# Patient Record
Sex: Male | Born: 1955 | Race: White | Hispanic: No | Marital: Single | Smoking: Former smoker
Health system: Southern US, Community
[De-identification: ages and names within clinical notes are randomized; demographics above are authoritative.]

## PROBLEM LIST (undated history)

## (undated) ENCOUNTER — Encounter
Attending: Pharmacist Clinician (PhC)/ Clinical Pharmacy Specialist | Primary: Pharmacist Clinician (PhC)/ Clinical Pharmacy Specialist

## (undated) ENCOUNTER — Ambulatory Visit

## (undated) ENCOUNTER — Telehealth

## (undated) ENCOUNTER — Encounter

## (undated) ENCOUNTER — Ambulatory Visit: Attending: Family | Primary: Family

## (undated) ENCOUNTER — Ambulatory Visit: Attending: Gastroenterology | Primary: Gastroenterology

## (undated) ENCOUNTER — Ambulatory Visit
Attending: Pharmacist Clinician (PhC)/ Clinical Pharmacy Specialist | Primary: Pharmacist Clinician (PhC)/ Clinical Pharmacy Specialist

## (undated) ENCOUNTER — Ambulatory Visit: Attending: Family Medicine | Primary: Family Medicine

## (undated) ENCOUNTER — Encounter
Attending: Student in an Organized Health Care Education/Training Program | Primary: Student in an Organized Health Care Education/Training Program

## (undated) ENCOUNTER — Ambulatory Visit: Attending: Vascular Surgery | Primary: Vascular Surgery

## (undated) ENCOUNTER — Encounter: Attending: Family | Primary: Family

## (undated) ENCOUNTER — Encounter: Attending: Nurse Practitioner | Primary: Nurse Practitioner

## (undated) ENCOUNTER — Telehealth: Attending: Family | Primary: Family

## (undated) DIAGNOSIS — Z8619 Personal history of other infectious and parasitic diseases: Secondary | ICD-10-CM

## (undated) DIAGNOSIS — K7460 Unspecified cirrhosis of liver: Secondary | ICD-10-CM

---

## 2006-07-07 ENCOUNTER — Ambulatory Visit: Payer: Self-pay | Admitting: Family Medicine

## 2006-07-08 ENCOUNTER — Ambulatory Visit: Payer: Self-pay | Admitting: Family Medicine

## 2006-07-10 ENCOUNTER — Other Ambulatory Visit: Payer: Self-pay

## 2006-07-17 ENCOUNTER — Ambulatory Visit: Payer: Self-pay | Admitting: General Surgery

## 2011-12-12 DIAGNOSIS — K7460 Unspecified cirrhosis of liver: Secondary | ICD-10-CM | POA: Diagnosis present

## 2012-08-13 DIAGNOSIS — F1110 Opioid abuse, uncomplicated: Secondary | ICD-10-CM | POA: Diagnosis present

## 2017-10-23 ENCOUNTER — Ambulatory Visit: Admit: 2017-10-23 | Discharge: 2017-10-24

## 2017-10-23 DIAGNOSIS — I10 Essential (primary) hypertension: Principal | ICD-10-CM

## 2017-11-11 ENCOUNTER — Ambulatory Visit: Admit: 2017-11-11 | Discharge: 2017-11-12 | Attending: Gastroenterology | Primary: Gastroenterology

## 2017-11-11 DIAGNOSIS — B1920 Unspecified viral hepatitis C without hepatic coma: Principal | ICD-10-CM

## 2017-11-11 LAB — MONOCYTES ABSOLUTE COUNT: Lab: 0.4

## 2017-11-11 LAB — CBC W/ AUTO DIFF
BASOPHILS ABSOLUTE COUNT: 0 10*9/L (ref 0.0–0.1)
EOSINOPHILS ABSOLUTE COUNT: 0.1 10*9/L (ref 0.0–0.4)
HEMATOCRIT: 37.2 % — ABNORMAL LOW (ref 41.0–53.0)
HEMOGLOBIN: 12.4 g/dL — ABNORMAL LOW (ref 13.5–17.5)
LARGE UNSTAINED CELLS: 3 % (ref 0–4)
LYMPHOCYTES ABSOLUTE COUNT: 1.2 10*9/L — ABNORMAL LOW (ref 1.5–5.0)
MEAN CORPUSCULAR HEMOGLOBIN CONC: 33.2 g/dL (ref 31.0–37.0)
MEAN CORPUSCULAR HEMOGLOBIN: 37.1 pg — ABNORMAL HIGH (ref 26.0–34.0)
MEAN CORPUSCULAR VOLUME: 111.6 fL — ABNORMAL HIGH (ref 80.0–100.0)
MONOCYTES ABSOLUTE COUNT: 0.4 10*9/L (ref 0.2–0.8)
NEUTROPHILS ABSOLUTE COUNT: 2.2 10*9/L (ref 2.0–7.5)
PLATELET COUNT: 58 10*9/L — ABNORMAL LOW (ref 150–440)
RED BLOOD CELL COUNT: 3.34 10*12/L — ABNORMAL LOW (ref 4.50–5.90)
RED CELL DISTRIBUTION WIDTH: 14.5 % (ref 12.0–15.0)
WBC ADJUSTED: 4 10*9/L — ABNORMAL LOW (ref 4.5–11.0)

## 2017-11-11 LAB — SMEAR REVIEW

## 2017-11-11 LAB — HEPATITIS A IGG: Hepatitis A virus Ab.IgG:PrThr:Pt:Ser:Ord:: REACTIVE — AB

## 2017-11-11 LAB — COMPREHENSIVE METABOLIC PANEL
ALBUMIN: 3 g/dL — ABNORMAL LOW (ref 3.5–5.0)
ALKALINE PHOSPHATASE: 100 U/L (ref 38–126)
ALT (SGPT): 40 U/L (ref 19–72)
ANION GAP: 9 mmol/L (ref 9–15)
AST (SGOT): 81 U/L — ABNORMAL HIGH (ref 19–55)
BILIRUBIN TOTAL: 1.2 mg/dL (ref 0.0–1.2)
BLOOD UREA NITROGEN: 19 mg/dL (ref 7–21)
CALCIUM: 9.1 mg/dL (ref 8.5–10.2)
CHLORIDE: 103 mmol/L (ref 98–107)
CO2: 28 mmol/L (ref 22.0–30.0)
EGFR MDRD AF AMER: >=60 mL/min/{1.73_m2} (ref >=60)
EGFR MDRD NON AF AMER: >=60 mL/min/{1.73_m2} (ref >=60)
GLUCOSE RANDOM: 72 mg/dL (ref 65–179)
PROTEIN TOTAL: 6.9 g/dL (ref 6.5–8.3)
SODIUM: 140 mmol/L (ref 135–145)

## 2017-11-11 LAB — HEPATITIS B SURFACE ANTIGEN: Hepatitis B virus surface Ag:PrThr:Pt:Ser:Ord:: NONREACTIVE

## 2017-11-11 LAB — PROTIME-INR: INR: 1.19

## 2017-11-11 LAB — PROTIME: Lab: 13.6 — ABNORMAL HIGH

## 2017-11-11 LAB — TRANSFERRIN: Transferrin:MCnc:Pt:Ser/Plas:Qn:: 248.8

## 2017-11-11 LAB — HEPATITIS B SURFACE ANTIBODY
HEPATITIS B SURFACE ANTIBODY QUANT: 187.17 m[IU]/mL — ABNORMAL HIGH (ref <8.00)
HEPATITIS B SURFACE ANTIBODY: REACTIVE — AB

## 2017-11-11 LAB — FERRITIN: Ferritin:MCnc:Pt:Ser/Plas:Qn:: 185

## 2017-11-11 LAB — IRON & TIBC
IRON: 123 ug/dL (ref 35–165)
TRANSFERRIN: 248.8 mg/dL (ref 200.0–380.0)

## 2017-11-11 LAB — CO2: Carbon dioxide:SCnc:Pt:Ser/Plas:Qn:: 28

## 2017-11-11 LAB — AFP-TUMOR MARKER: Alpha-1-Fetoprotein.tumor marker:MCnc:Pt:Ser/Plas:Qn:: 10.8 — ABNORMAL HIGH

## 2017-11-11 LAB — SLIDE REVIEW

## 2017-11-11 LAB — HEPATITIS B SURFACE ANTIBODY QUANT: Hepatitis B virus surface Ab:ACnc:Pt:Ser:Qn:: 187.17 — ABNORMAL HIGH

## 2017-11-11 NOTE — Unmapped (Addendum)
[facility]  FAST ??? Fibrosis Assessment Team  Division of Gastroenterology and Hepatology  ??  ??    ??  FIBROSCAN will be performed to assess hepatic fibrosis (scarring) in order to stage this patient's liver disease. This will assist with evaluating the natural course of the disease and will provide important information regarding prognosis, duration of therapy, and potential response to treatment. This information will also help assess risk for hepatocellular carcinoma and need for liver cancer surveillance.??  ??  FibroscanProcedure:   After obtaining verbal consent, the patient was placed in a supine position. Physical characteristics and landmarks were assessed to establish appropriate mid-axillary intercostal space for probe placement. 50Hz  Shear Wave pulses were applied and the resulting Shear Wave and Propagation Speed detected with a 3.5 MHz ultrasonic signal, using the FibroScan probe.  Skin to liver capsule distance and liver parenchyma were accessed during the entire examination with the FibroScan probe. The patient was instructed to breathe normally and to abstain from sudden movements during the procedure, allowing for random measurements of liver stiffness. At least ten Shear Waves were produced; individual measurements of each Shear Wave were calculated. Patient tolerated the procedure well and was discharged without incident.  ??  Probe used []  M+    Serial # [id number]                         [x]  XL+   Serial # [id number]  ??  Main Etiology of Liver Disease:  [x] HCV     [] HBV   [] Alcohol    [] NASH  [] PBC      [] PSC     [] Other________________  ??  50Hz  shear wave pulses were applied and the resulting shear wave and propagation speed detected with a 3.5 MHz ultrasonic signal, using the FibroScan probe.     ??  At least ten Shear Waves were produced; individual measurements of each shear wave were calculated.    ??  Patient tolerated the procedure well and was discharged without incident.  ??  Fibroscan score: ___33.4___kPa  ??  IQR:                       ___10___%  ??  Test performed by: Luiz Ochoa, RN  ??  [facility]  FAST ??? Fibrosis Assessment Team  Division of Gastroenterology and Hepatology  ??  ??    ??  ??  ??  Estimation of the stage of liver fibrosis (Metavir Score):  The results of the Liver Stiffness Score are consistent with the following liver fibrosis stage:  ??  ??  []  F0-F1             []  F2               []  F3               [x]  F4  ??  ??  GENERAL RECOMMENDATIONS ACCORDING TO THE STAGE OF LIVER FIBROSIS.  ??  F0-F1: No-minimal fibrosis. The risk of progression to advanced fibrosis and cirrhosis is low. If the cause of liver disease is not removed, a 1-2 yr follow-up study is recommended.   F2: Significant fibrosis. There is a moderate risk of progression to cirrhosis. If the cause of liver disease is not removed, a follow-up study in 12 months is recommended.  F3: Advanced (pre-cirrhotic stage). The risk of progression to cirrhosis is high. Imaging studies to rule out hepatocellular carcinoma  should be considered. Efforts to remove the cause of liver disease are highly recommended.  F4: Cirrhosis. There is significant risk of portal hypertension and esophageal varices. An upper endoscopy is recommended. Imaging studies for hepatocellular carcinoma screening are recommended.   ??  Any and all FibroScan studies must be carefully evaluated, taking fully into account all individual measurement/scans, patient history and other factors.  As with liver biopsy, any estimation of liver fibrosis may be subject to under or over staging due to sampling error.  Any further medical or surgical intervention should be made only while fully considering the circumstances of this patient and in consultation with this patient.    I have reviewed and interpreted the FIBROSCAN test results as described above.  ??  Alba Destine, [name]  Professor of Medicine  Director, [facility]  [place] at [place] [phone]

## 2017-11-11 NOTE — Unmapped (Signed)
Counseling for HCV treatment     B18.2 Hep C: yes    K74.60 Cirrhosis: yes,   Child Pugh Score if applicable and for Medicaid pts: N/A  Z94.4 Liver Transplant: no    Genotype: 1a (external records 09/08/17 pg 6)  HCV RNA: 1.5 million IU/mL   Date: 08/25/17 (external records 09/08/17 pg 6)   Fibrosis score: F4 (33.4 kPa) on Fibroscan 11/11/17  HIV Co-infection? No (11/06/11)  Signs of liver decompensation? No  Previous treatment? Treatment naive    Planned regimen: Epclusa (sofosbuvir/velpatasvir 400/100mg ) x 12 weeks  Urgency: Routine Request  Prescribing Provider/NPI: Dr. Gavin Potters / [id number]  Signature waiver form not obtained at this time.  Insurance: None, Glass [employer] Patient Assistance Program (MPAP) application with patient.       Colin Valentine is a 62 y.o. caucasian male presents to clinic alone and is interested in starting treatment with Epclusa.   We discussed the authorization process of obtaining the medication through MPAP and that this may take some time.  Stressed importance of being able to be reached by phone. Advised would need to complete his screening procedures including MRI, EGD and colonoscopy before we proceed with treatment. Pt in agreement.      Current medications:  Current Outpatient Prescriptions   Medication Sig Dispense Refill   ??? cloNIDine (CATAPRES) 0.1 MG tablet Take 0.1 mg by mouth. :one pill up to twice daily for withdrawl symptoms     ??? furosemide (LASIX) 20 MG tablet Take 20 mg by mouth daily.     ??? methadone (DOLOPHINE) 10 mg/5 mL solution Take 95 mg by mouth daily.       No current facility-administered medications for this visit.    Other medications: Naproxen PRN (takes almost daily for foot pain)    Following topics were discussed during counseling:    1. Indications for medication, dosage and administration.     A. Epclusa 400/100mg  1 tablet to take daily with or without food.    2. Common side effects of medications and management strategies. (fatigue, headache)     A. Advised patient to avoid use of naproxen. APAP can be used instead (limit to < 2,000 mg/day).    3. Importance of adherence to regimen, follow-up clinic visits and lab monitoring.     A. Asked patient to call [facility] [phone] to establish start date for treatment and to schedule appointment 4 weeks before starting treatment.    4. Drug-drug interaction.    A. Current medications have been reviewed and assessed for possible interaction.  We discussed the mechanism of drug-drug interaction with acid lowering agents. Advised to check with MD or pharmacist before taking any OTC/herbal medications, with emphasis regarding indigestion/heartburn medications.  Denies use of OTC heart burn medications or herbal medication such as milk thistle or St. John's wart. Recommended to avoid use of NSAIDs given cirrhosis. Allergies have been verified. Denies alcohol intake.    5. Importance of informing pharmacy and clinic of updated contact information.  Discussed the process of obtaining medication through specialty pharmacy and when approved medication will be delivered to patient's home. Stressed importance of being able to be reached over the phone.    Patient verbalized understanding. Provided contact information for any questions/concerns.     Frances Maywood, PharmD, BCPS  PGY2 Ambulatory Care Resident  Pgr #: [phone]    Park Breed, Pharm D., BCPS, BCGP, CPP  [facility]  [address]  [place], [place]  [address]  [phone]    This portion of the visit was 20 minutes in duration and greater than 50% was spend in direct counseling and coordination of care regarding hepatitis C medication management.

## 2017-11-11 NOTE — Unmapped (Addendum)
Chronic hepatitis C with evidence of cirrhosis. Congratulations on stopping drinking and it is important that you remain abstinent from alcohol to maintain liver health. You are also at risk for fatty liver disease due to being overweight.  Losing 30 lbs with better diet and increasing walking would be helpful to maintain liver health. You will have a colonoscopy and upper endoscopy scheduled to evaluate for dilated blood vessels in the esophagus. You need an MRI every 6 months to evaluate for the presence of liver cancer for which you are at risk. You will emet our pharmacist Erskine Squibb to discuss treatment for hepatitis C with medications that have a high rate of cure and a low risk of side effects. Return to office in 3 months or sooner to see Owens Shark DNP once the hepatitis C medications are started.

## 2017-11-11 NOTE — Unmapped (Signed)
[name], [name]  Professor of Medicine  Director, [facility]  [place] [facility] [place] at [place]    [phone]    Althea Grimmer, MD  [address] [facility]  [place], [place] [address]     Chief complaint: Patient is referred for consultation for chronic hepatitis C genotype 1 a.    Present illness: Patient is a 62 y.o. Caucasian male with chronic hepatitis C genotype 1 a that he states was diagnosed approximately 2 years ago when he was hospitalized for pneumonia.  The patient had a period of intravenous drug use between 1989 and 1999.  He never had acute icteric hepatitis.  Overall the patient states he feels pretty good.  He denies nausea, vomiting, abdominal pain, chest pain, or shortness of breath.  He has not had any GI bleeding, mental confusion, or leg edema.    10 system review of systems: Patient has had a voluntary weight loss of about 20 pounds over the last several months.  He also has a kidney infection which is being treated by his primary care physician.  He notes occasional weakness.    Past medical history:  1.  Hypertension  2.  Venous stasis changes of his lower extremities which are being managed and wrapped by his primary care physician.  3.  History of thrombocytopenia with decreased albumin noted on previous lab work.  4.  No history of diabetes, coronary artery disease, or lung disease.  5.  FIBROSCAN 10/2017: 33.3 kPa consistent with stage F4 cirrhosis    Allergies   Allergen Reactions   ??? Penicillins Hives       Current Outpatient Prescriptions   Medication Sig Dispense Refill   ??? cloNIDine (CATAPRES) 0.1 MG tablet Take 0.1 mg by mouth. :one pill up to twice daily for withdrawl symptoms     ??? furosemide (LASIX) 20 MG tablet Take 20 mg by mouth daily.     ??? methadone (DOLOPHINE) 10 mg/5 mL solution Take 95 mg by mouth daily.       No current facility-administered medications for this visit.        Social history: Patient is not married.  He has no children.  He works as an Scientist, water quality.  He smokes a half a pack of cigarettes per day.  He formerly drank every day (audit score equals 8).  He discontinued all alcohol as of January 1.    Family history: Negative for liver disease or liver cancer    BP 143/64  - Pulse 80  - Temp 37.6 ??C (99.7 ??F)  - Resp 20  - Ht 180.3 cm (5' 11)  - Wt (!) 140.9 kg (310 lb 11.2 oz)  - BMI 43.33 kg/m??     Pleasant individual in NAD, morbidly obese    HEENT: Sclera are anicteric, no temporal muscle loss, oropharynx is negative  NECK: No thyromegaly or lymphadenopathy, No carotid bruits  Chest: Clear to auscultation and percussion  Heart: S1, S2, RR, No murmurs  Abdomen: Soft, non-tender, non-distended, +hepatosplenomegaly, no masses appreciated, no ascites  Skin: +spider angiomata, No rashes  Extremities: Without pedal edema, no palmar erythema  Neuro: Grossly intact, No focal deficits    Results for orders placed or performed in visit on 11/11/17   Comprehensive Metabolic Panel   Result Value Ref Range    Sodium 140 135 - 145 mmol/L    Potassium  4.2 3.5 - 5.0 mmol/L    Chloride 103 98 - 107 mmol/L    CO2 28.0 22.0 - 30.0 mmol/L    BUN 19 7 - 21 mg/dL    Creatinine 1.61 0.96 - 1.30 mg/dL    BUN/Creatinine Ratio 20     EGFR MDRD Non Af Amer >=60 >=60 mL/min/1.7m2    EGFR MDRD Af Amer >=60 >=60 mL/min/1.66m2    Anion Gap 9 9 - 15 mmol/L    Glucose 72 65 - 179 mg/dL    Calcium 9.1 8.5 - 04.5 mg/dL    Albumin 3.0 (L) 3.5 - 5.0 g/dL    Total Protein 6.9 6.5 - 8.3 g/dL    Total Bilirubin 1.2 0.0 - 1.2 mg/dL    AST 81 (H) 19 - 55 U/L    ALT 40 19 - 72 U/L    Alkaline Phosphatase 100 38 - 126 U/L   PT-INR   Result Value Ref Range    PT 13.6 (H) 10.2 - 12.8 sec    INR 1.19    Iron Level and TIBC   Result Value Ref Range    Iron 123 35 - 165 ug/dL    TIBC 409.8 119.1 - 478.2 mg/dL    Transferrin 956.2 130.8 - 380.0 mg/dL    Iron Saturation (%) 39 20 - 50 %   Ferritin   Result Value Ref Range    Ferritin 185.0 27.0 - 377.0 ng/mL   AFP tumor marker   Result Value Ref Range    AFP-Tumor Marker 10.80 (H) <7.51 ng/mL   Hepatitis A IgG   Result Value Ref Range    Hepatitis A IgG Reactive (A) Nonreactive   Hepatitis B Core Antibody, total   Result Value Ref Range    Hep B Core Total Ab Reactive (A) Nonreactive   Hepatitis B Surface Antibody   Result Value Ref Range    Hep B S Ab Reactive (A) Nonreactive, Grayzone    Hepatitis B Surface Ab Quant 187.17 (H) <8.00 m(IU)/mL   Hepatitis B Surface Antigen   Result Value Ref Range    Hepatitis B Surface Ag Nonreactive Nonreactive   CBC w/ Differential   Result Value Ref Range    WBC 4.0 (L) 4.5 - 11.0 10*9/L    RBC 3.34 (L) 4.50 - 5.90 10*12/L    HGB 12.4 (L) 13.5 - 17.5 g/dL    HCT 65.7 (L) 84.6 - 53.0 %    MCV 111.6 (H) 80.0 - 100.0 fL    MCH 37.1 (H) 26.0 - 34.0 pg    MCHC 33.2 31.0 - 37.0 g/dL    RDW 96.2 95.2 - 84.1 %    MPV 9.4 7.0 - 10.0 fL    Platelet 58 (L) 150 - 440 10*9/L    Absolute Neutrophils 2.2 2.0 - 7.5 10*9/L    Absolute Lymphocytes 1.2 (L) 1.5 - 5.0 10*9/L    Absolute Monocytes 0.4 0.2 - 0.8 10*9/L    Absolute Eosinophils 0.1 0.0 - 0.4 10*9/L    Absolute Basophils 0.0 0.0 - 0.1 10*9/L    Large Unstained Cells 3 0 - 4 %    Macrocytosis Marked (A) Not Present   Morphology Review   Result Value Ref Range    Smear Review Comments See Comment (A) Undefined     MELD-Na score: 9 at 11/11/2017 11:14 AM  MELD score: 9 at 11/11/2017 11:14 AM  Calculated from:  Serum Creatinine: 0.94 mg/dL (Rounded to 1) at 01/03/4009 11:14  AM  Serum Sodium: 140 mmol/L (Rounded to 137) at 11/11/2017 11:14 AM  Total Bilirubin: 1.2 mg/dL at 1/61/0960 45:40 AM  INR(ratio): 1.19 at 11/11/2017 11:14 AM  Age: 19 years      Impression:  1.  Chronic hepatitis C genotype 1 a: Patient appears to have cirrhosis based upon physical examination, laboratory data, and FIBROSCAN results.  From his prior labs he had thrombocytopenia suggesting portal hypertension and also decreased albumin suggesting possible evidence of borderline decompensation.  The patient will be a good candidate for antiviral therapy with the regimen that does not contain a protease inhibitor, given that he may have borderline hepatic decompensation.    2.  HCC surveillance: Patient was scheduled for MRI and should undergo HCC surveillance every 6 months.    3.  Alcohol abuse: Patient is now abstinent since beginning of 2019.  I have congratulated him on being abstinent and we discussed the importance of remaining abstinent to maintain liver health.    4.  Risk for fatty liver disease: Patient is morbidly obese and likely has an element of fatty liver disease contributing to his cirrhosis.  He has been successful at weight loss over the last several months and I have congratulated him in this regard.  He should continue to work on weight loss in order to maintain liver health as well.    5.  Portal hypertension: Patient has evidence of thrombocytopenia in the setting of cirrhosis.  He will be scheduled for upper endoscopy.  He will also have screening colonoscopy at the same time.    6.  Check vaccination status for hepatitis A and hepatitis B and appropriate vaccinations at the time of his next visit.    The patient return for follow-up in approximately 3 months or sooner once hepatitis C medications have been started to see Owens Shark DNP.    Colin Valentine, [name]  Professor of Medicine  Director, [facility]  [place] of [place] at [place]    [phone]

## 2017-11-12 LAB — HEPATITIS B CORE TOTAL ANTIBODY: Hepatitis B virus core Ab:PrThr:Pt:Ser/Plas:Ord:IA: REACTIVE — AB

## 2017-12-08 ENCOUNTER — Ambulatory Visit: Admit: 2017-12-08 | Discharge: 2017-12-08

## 2017-12-08 DIAGNOSIS — B1920 Unspecified viral hepatitis C without hepatic coma: Principal | ICD-10-CM

## 2017-12-18 NOTE — Unmapped (Signed)
I have reviewed and interpreted the FIBROSCAN test results as described above.    Alba Destine, [name]  Professor of Medicine  Director, [facility]  [place] at [facility]

## 2018-01-06 ENCOUNTER — Ambulatory Visit: Admit: 2018-01-06 | Discharge: 2018-01-07

## 2018-03-11 ENCOUNTER — Ambulatory Visit: Admit: 2018-03-11 | Discharge: 2018-03-12 | Attending: Family | Primary: Family

## 2018-03-11 DIAGNOSIS — D649 Anemia, unspecified: Secondary | ICD-10-CM

## 2018-03-11 DIAGNOSIS — B182 Chronic viral hepatitis C: Principal | ICD-10-CM

## 2018-03-11 DIAGNOSIS — R718 Other abnormality of red blood cells: Secondary | ICD-10-CM

## 2018-03-11 DIAGNOSIS — K7460 Unspecified cirrhosis of liver: Secondary | ICD-10-CM

## 2018-03-11 LAB — BASIC METABOLIC PANEL
ANION GAP: 4 mmol/L — ABNORMAL LOW (ref 9–15)
BLOOD UREA NITROGEN: 17 mg/dL (ref 7–21)
BUN / CREAT RATIO: 23
CALCIUM: 8.3 mg/dL — ABNORMAL LOW (ref 8.5–10.2)
CHLORIDE: 102 mmol/L (ref 98–107)
CO2: 33 mmol/L — ABNORMAL HIGH (ref 22.0–30.0)
CREATININE: 0.75 mg/dL (ref 0.70–1.30)
EGFR MDRD AF AMER: >=60 mL/min/{1.73_m2} (ref >=60)
EGFR MDRD NON AF AMER: >=60 mL/min/{1.73_m2} (ref >=60)
GLUCOSE RANDOM: 108 mg/dL (ref 65–179)
POTASSIUM: 3 mmol/L — ABNORMAL LOW (ref 3.5–5.0)

## 2018-03-11 LAB — HEPATIC FUNCTION PANEL
ALBUMIN: 2.6 g/dL — ABNORMAL LOW (ref 3.5–5.0)
ALKALINE PHOSPHATASE: 98 U/L (ref 38–126)
AST (SGOT): 57 U/L — ABNORMAL HIGH (ref 19–55)
BILIRUBIN DIRECT: 0.4 mg/dL (ref 0.00–0.40)
PROTEIN TOTAL: 6.4 g/dL — ABNORMAL LOW (ref 6.5–8.3)

## 2018-03-11 LAB — CBC W/ AUTO DIFF
BASOPHILS ABSOLUTE COUNT: 0 10*9/L (ref 0.0–0.1)
BASOPHILS RELATIVE PERCENT: 1 %
EOSINOPHILS ABSOLUTE COUNT: 0.2 10*9/L (ref 0.0–0.4)
EOSINOPHILS RELATIVE PERCENT: 4.9 %
HEMATOCRIT: 32.7 % — ABNORMAL LOW (ref 41.0–53.0)
HEMOGLOBIN: 11.3 g/dL — ABNORMAL LOW (ref 13.5–17.5)
LARGE UNSTAINED CELLS: 2 % (ref 0–4)
LYMPHOCYTES ABSOLUTE COUNT: 1 10*9/L — ABNORMAL LOW (ref 1.5–5.0)
LYMPHOCYTES RELATIVE PERCENT: 23 %
MEAN CORPUSCULAR HEMOGLOBIN CONC: 34.6 g/dL (ref 31.0–37.0)
MEAN CORPUSCULAR HEMOGLOBIN: 36.9 pg — ABNORMAL HIGH (ref 26.0–34.0)
MEAN CORPUSCULAR VOLUME: 106.6 fL — ABNORMAL HIGH (ref 80.0–100.0)
MEAN PLATELET VOLUME: 9.4 fL (ref 7.0–10.0)
MONOCYTES ABSOLUTE COUNT: 0.4 10*9/L (ref 0.2–0.8)
MONOCYTES RELATIVE PERCENT: 9.3 %
NEUTROPHILS ABSOLUTE COUNT: 2.5 10*9/L (ref 2.0–7.5)
NEUTROPHILS RELATIVE PERCENT: 59.4 %
PLATELET COUNT: 59 10*9/L — ABNORMAL LOW (ref 150–440)
RED CELL DISTRIBUTION WIDTH: 14.1 % (ref 12.0–15.0)

## 2018-03-11 LAB — MONOCYTES RELATIVE PERCENT: Lab: 9.3

## 2018-03-11 LAB — SODIUM: Sodium:SCnc:Pt:Ser/Plas:Qn:: 139

## 2018-03-11 LAB — ALBUMIN: Albumin:MCnc:Pt:Ser/Plas:Qn:: 2.6 — ABNORMAL LOW

## 2018-03-11 LAB — INR: Lab: 1.23

## 2018-03-11 LAB — SMEAR REVIEW

## 2018-03-11 MED ORDER — SOFOSBUVIR 400 MG-VELPATASVIR 100 MG TABLET
ORAL_TABLET | Freq: Every day | ORAL | 2 refills | 0 days | Status: CP
Start: 2018-03-11 — End: 2018-09-14

## 2018-03-11 NOTE — Unmapped (Signed)
1.  Laboratory studies ordered today to update your liver status and Hep C viral level ordered.   2.  Will reach out to you once I have heard from our pharmacist about the status of your hepatitis C medication.   3.  Please follow up and make sure you have been scheduled for both colonoscopy as well as upper endoscopy as we discussed.   4.  Office follow up two months.   5.  No alcohol use or illicit drug use.   6.  Healthy diet, regular exercise program and weight management. Keep up the good job trying to lose weight.   7.  Any questions please let me know.

## 2018-03-11 NOTE — Unmapped (Signed)
[facility] LIVER CENTER    Colin Valentine, [name]  Professor of Medicine  Director, [facility]  [facility] [place] [facility] [place]    [phone]    Colin Camp, MD  [address]  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  Chronic hepatitis C genotype 1 a.    Present illness: Patient is a 62 y.o. Caucasian male with chronic hepatitis C genotype 1A who presents today for reevaluation and management of HCV.  He was initially seen in consultation by Dr. Sharon Mt on 11/11/2017.  From his note as well as Park Breed, clinical pharmacist with our department, it was discussed for him to receive Eplcusa x 12 weeks through MPAP. He presents today advising he has still not received DAA medication, thus he has not been started on treatment yet.     He presents alone today.  He advises he has been doing very well since his last visit.  Pending being scheduled for colonoscopy for CRC screening and EGD for variceal screening.  Orders were placed last visit but no documented appointment date noted in Epic. Pending established visit with wound care center. Medical history significant for venous stasis. Additionally, his medical history is significant for HTN and opioid abuse (remission x 15 years).     He was diagnosed approximately 2 years ago when he was hospitalized for pneumonia. The patient had a period of intravenous drug use between 1989 and 1999 (heroin, dilaudid, and morphine).  He never had acute icteric hepatitis.  Overall he feels he has been doing well. His biggest concern is continued battling of venous stasis which has not been well controlled for awhile. He continues to wrap both lower legs with gauze.  Right leg worse then left leg. He denies nausea, vomiting, abdominal pain, chest pain, or shortness of breath.  He has not had any GI bleeding, mental confusion, jaundice or pruritis. Voluntary weight loss of about 23 pounds over the last several months.     ROS: All systems reviewed and negative except in HPI.     Past medical history:  Active Ambulatory Problems     Diagnosis Date Noted   ??? Atrial fibrillation (CMS-HCC) 08/13/2012   ??? Cirrhosis of liver (CMS-HCC) 12/12/2011   ??? Chronic hepatitis C (CMS-HCC) 08/13/2012   ??? Opiate abuse, episodic (CMS-HCC) 08/13/2012   ??? Thrombocytopenia (CMS-HCC) 11/06/2011     Resolved Ambulatory Problems     Diagnosis Date Noted   ??? No Resolved Ambulatory Problems     Past Medical History:   Diagnosis Date   ??? Cirrhosis (CMS-HCC)    ??? Hepatitis C    ??? Hypertension    ??? Opioid use disorder, severe, in sustained remission (CMS-HCC)    ??? Venous stasis dermatitis of both lower extremities    No history of diabetes, coronary artery disease, or lung disease.    Liver Section:   1.  FIBROSCAN 10/2017: 33.3 kPa consistent with stage F4 cirrhosis  2.  Chronic hepatitis C, treatment naive, genotype 1A    3.  + immunity hepatitis A and B.  4.  HIV - non reactive   5.  Variceal screening: Pending being scheduled for EGD.   6. Colon cancer screening: Pending being scheduled for colonoscopy.   7. HCC screening: MRI of abdomen 12/08/2017 - Cirrhotic liver morphology with 1 fissures, left  hepatic lobe and caudate hypertrophy. Scattered subcentimeter simple hepatic cysts. No lesions with arterial enhancement, washout, +/- pseudocapsule to suggest hepatocellular carcinoma. + Splenomegaly with no evidence of ascites.    Multiple subcentimeter retrocrural, periportal, gastrohepatic, and retroperitoneal lymph nodes. For reference: A 1.2 cm porta hepatis lymph node. Large lower abdominal ventral hernia with mesenteric fat, mesenteric varices, and wide neck measuring approximately 4.8 cm (8:62). Rectus diastases.     8. MELD-Na score: 9 at 03/11/2018 11:07 AM  MELD score: 9 at 03/11/2018 11:07 AM  Calculated from:  Serum Creatinine: 0.75 mg/dL (Rounded to 1 mg/dL) at 03/10/4131 44:01 AM  Serum Sodium: 139 mmol/L (Rounded to 137 mmol/L) at 03/11/2018 11:07 AM  Total Bilirubin: 1.2 mg/dL at 0/27/2536 64:40 AM  INR(ratio): 1.23 at 03/11/2018 11:07 AM  Age: 32 years    Allergies   Allergen Reactions   ??? Penicillins Hives       Current Outpatient Medications   Medication Sig Dispense Refill   ??? hydroCHLOROthiazide (HYDRODIURIL) 25 MG tablet Take 25 mg by mouth daily.   5   ??? methadone (DOLOPHINE) 10 mg/5 mL solution Take 100 mg by mouth daily.        No current facility-administered medications for this visit.      Social history: Patient is not married.  He has no children. He works as an Scientist, water quality.  He smokes a half a pack of cigarettes per day.  He formerly drank every day (audit score equals 8).  He discontinued all alcohol as of October 13, 2017.    Family history: Negative for liver disease or liver cancer. Unremarkable family history per patient.     Physical Examination:   BP 136/73  - Pulse 72  - Temp 36.9 ??C (98.4 ??F) (Oral)  - Resp 18  - Ht 180.3 cm (5' 10.98)  - Wt (!) 139.9 kg (308 lb 8 oz)  - SpO2 99%  - BMI 43.05 kg/m??   General: Pleasant individual in NAD, morbidly obese  HEENT: Sclera are anicteric, no temporal muscle loss, oropharynx is negative  NECK: No thyromegaly or lymphadenopathy, No carotid bruits  Chest: Clear to auscultation and percussion  Heart: S1, S2, RR, No murmurs  GI: Abdomen Soft, non-tender, non-distended, +hepatosplenomegaly, no masses appreciated, no ascites. + abdominal hernia  Skin: +spider angiomata, No rashes  Extremities: Without pedal edema, no palmar erythema  Neuro: Grossly intact, No focal deficits    Laboratory studies:  Results for orders placed or performed in visit on 03/11/18   PT-INR   Result Value Ref Range    PT 14.0 (H) 10.2 - 12.8 sec    INR 1.23    Hepatic Function Panel   Result Value Ref Range    Albumin 2.6 (L) 3.5 - 5.0 g/dL    Total Protein 6.4 (L) 6.5 - 8.3 g/dL    Total Bilirubin 1.2 0.0 - 1.2 mg/dL    Bilirubin, Direct 3.47 0.00 - 0.40 mg/dL    AST 57 (H) 19 - 55 U/L    ALT 33 19 - 72 U/L    Alkaline Phosphatase 98 38 - 126 U/L   Basic metabolic panel   Result Value Ref Range    Sodium 139 135 - 145 mmol/L    Potassium 3.0 (L) 3.5 - 5.0 mmol/L    Chloride 102 98 - 107 mmol/L    CO2 33.0 (H) 22.0 - 30.0 mmol/L    BUN 17 7 - 21 mg/dL    Creatinine 4.25  0.70 - 1.30 mg/dL    BUN/Creatinine Ratio 23     EGFR MDRD Non Af Amer >=60 >=60 mL/min/1.70m2    EGFR MDRD Af Amer >=60 >=60 mL/min/1.29m2    Anion Gap 4 (L) 9 - 15 mmol/L    Glucose 108 65 - 179 mg/dL    Calcium 8.3 (L) 8.5 - 10.2 mg/dL   CBC w/ Differential   Result Value Ref Range    WBC 4.1 (L) 4.5 - 11.0 10*9/L    RBC 3.07 (L) 4.50 - 5.90 10*12/L    HGB 11.3 (L) 13.5 - 17.5 g/dL    HCT 16.1 (L) 09.6 - 53.0 %    MCV 106.6 (H) 80.0 - 100.0 fL    MCH 36.9 (H) 26.0 - 34.0 pg    MCHC 34.6 31.0 - 37.0 g/dL    RDW 04.5 40.9 - 81.1 %    MPV 9.4 7.0 - 10.0 fL    Platelet 59 (L) 150 - 440 10*9/L    Neutrophils % 59.4 %    Lymphocytes % 23.0 %    Monocytes % 9.3 %    Eosinophils % 4.9 %    Basophils % 1.0 %    Absolute Neutrophils 2.5 2.0 - 7.5 10*9/L    Absolute Lymphocytes 1.0 (L) 1.5 - 5.0 10*9/L    Absolute Monocytes 0.4 0.2 - 0.8 10*9/L    Absolute Eosinophils 0.2 0.0 - 0.4 10*9/L    Absolute Basophils 0.0 0.0 - 0.1 10*9/L    Large Unstained Cells 2 0 - 4 %    Macrocytosis Marked (A) Not Present   Morphology Review   Result Value Ref Range    Smear Review Comments See Comment (A) Undefined     Impression/Plan:  1.  Chronic hepatitis C genotype 1 A : Patient appears to have cirrhosis based upon physical examination, laboratory data, and FIBROSCAN results.  Imaging findings consistent with portal hypertension. History of  thrombocytopenia which also suggested portal hypertension and along with decreased albumin suggesting possible evidence of borderline decompensation. The patient will be a good candidate for antiviral therapy with the regimen that does not contain a protease inhibitor, given that he may have borderline hepatic decompensation. Last visit recommendation was to proceed with Epclusa x 12 weeks.     Per our pharmacist, Dr. Foy Guadalajara had requested Mr. Fletchall to proceed with [facility] screening as well as endoscopy evaluation prior of initiation of DAA treatment.     ~ MELD labs and HCV RNA level ordered today.     2.  HCC surveillance: Up to date. [place]arrant surveillance every six months minimal. He should undergo HCC surveillance every 6 months.    3.  Alcohol abuse: No alcohol use. AUDIT score = zero.     4.  Risk for fatty liver disease: Patient is morbidly obese and likely has an element of fatty liver disease contributing to his cirrhosis.  He continues to be successful with weight loss over the last several months and I have congratulated him in this regard. He should continue to work on weight loss in order to maintain liver health as well.    5. Portal hypertension: Pending EGD to be performed. Order previously placed.     6. Colon cancer screening: Order has been placed for screening colonoscopy ~ pending being scheduled.     7. Positive immunity to both hepatitis A and B.     8.  HCV treatment: Spoke with Park Breed, our clinical pharmacy. We will  proceed with HCV treatment as next available endoscopy appointment for this patient (cirrhotic at [facility]) to be scheduled is September. Another patient assistance application given. Patient advises he already competed the forms once, but willing to do it again. Treatment goal of Epclusa x 12 weeks.     All patient's questions were answered to his satisfaction during office visit today.     Rodman Key, DNP, FNP-BC  [facility] Liver Program  [address]  [place] [address]  Phone [phone]

## 2018-03-11 NOTE — Unmapped (Signed)
Message left for patient in reference to his laboratory results from today. Reviewed potassium level being low.  Instructed him to follow up with his PCP as possible secondary to HCTZ use. Additionally, low albumin level and protein.  Stressed importance of eating lean protein diet. Goal of 30 grams of lean protein with each meal and high protein snack at bedtime. Increasing protein will also help with BLE.

## 2018-03-12 MED ORDER — SOFOSBUVIR 400 MG-VELPATASVIR 100 MG TABLET
ORAL | 2 refills | 0 days
Start: 2018-03-12 — End: 2018-09-14

## 2018-03-12 NOTE — Unmapped (Signed)
Per test claim for EPCLUSA 400-100 TABLET at the [facility], patient needs Medication Assistance Program for Manufacturer Assistance.

## 2018-03-12 NOTE — Unmapped (Signed)
I was the supervising physician in the delivery of the service. Alba Destine, MD

## 2018-03-15 LAB — HEPATITIS C RNA, QUANTITATIVE, PCR: HCV RNA LOG10: 5.89 {Log_IU}/mL — ABNORMAL HIGH (ref <0.00)

## 2018-03-15 LAB — HCV RNA LOG10: Lab: 5.89 — ABNORMAL HIGH

## 2018-03-15 NOTE — Unmapped (Signed)
made pt aware of need to have labs repeated due to low potassium levels. pt verbalized agreement with no further questions.  Lab results faxed to Dr. Laure[facility].

## 2018-04-12 NOTE — Unmapped (Signed)
Reason for call: Follow up on Mfr Assistance Application for Epclusa    Hep C  Genotype: 1a (external records 09/08/17 pg 6)  Treatment: Epclusa x 12 wks  Fibrosis: F4 (33.4 kPa) on Fibroscan 11/11/17    [facility] has made multiple attempts to reach pt to follow up on Mfr Assistance Application for Epclusa. Called and spoke with pt at phone number [phone]. Pt stated he has not had the chance to complete the application and get it in the mail. Stressed the importance of completing the application so we can get him approved for the medication to treat the Hep C virus. Pt stated he will complete the application and get it in the mail today. Provided contact information if pt has any further questions.       Vertell Limber RN, BSN  Nursing Care Coordinator   Pharmacy Adult GI Medicine  [facility]  [address]   [place], [place] [address]  [phone]

## 2018-04-22 NOTE — Unmapped (Signed)
Reason for call: Follow up on Mfr Assistance Application for Epclusa  ??  Hep C  Genotype: 1a (external records 09/08/17 pg 6)  Treatment: Epclusa x 12 wks  Fibrosis: F4 (33.4 kPa) on Fibroscan 11/11/17  ??  [facility] has made multiple attempts to reach pt to follow up on Mfr Assistance Application for Epclusa. Spoke with pt on 04/12/18 and he was going to put application in the mail, but still have not received it. Called pt at phone number [phone] and left VM request for pt to return call. Contact information provided.      Vertell Limber RN, BSN  Nursing Care Coordinator   Pharmacy Adult GI Medicine  [facility]  [address]   [place], [place] [address]  [phone]

## 2018-05-12 NOTE — Unmapped (Addendum)
Reason for call:??Follow up on Mfr Assistance Application for Epclusa  ??  Hep C  Genotype:??1a (external records 09/08/17 pg 6)  Treatment:??Epclusa x 12 wks  Fibrosis:??F4 (33.4 kPa) on Fibroscan 11/11/17    [facility] has made multiple attempts to reach pt to follow up on Mfr Assistance Application for Epclusa. Per last conversation with pt on 04/22/18, he was going to send in application, but we have not received it. I called pt at phone number [phone] and left a VM with request for pt to return call. Contact information provided.   Pt returned call. Pt stated he has the applications and decided not to mail them. Pt is brining the applications with him to his apt tomorrow and will request for them to be given to [place], CPP.       Vertell Limber RN, BSN  Nursing Care Coordinator   Pharmacy Adult GI Medicine  [facility]  [address]   [place], [place] [address]  [phone]

## 2018-05-13 ENCOUNTER — Ambulatory Visit: Admit: 2018-05-13 | Discharge: 2018-05-14 | Attending: Family | Primary: Family

## 2018-05-13 DIAGNOSIS — K7460 Unspecified cirrhosis of liver: Principal | ICD-10-CM

## 2018-05-13 DIAGNOSIS — K869 Disease of pancreas, unspecified: Secondary | ICD-10-CM

## 2018-05-13 NOTE — Unmapped (Signed)
1.  Office follow up three months, sooner per treatment protocol. Once we know you are on treatment, then follow up will be four weeks after you have started your medication.   2.  Remain scheduled for MRI of abdomen the first of September for liver cancer screening.   3.  Bring your paperwork back to our clinic on Monday.   4.  Continue healthy diet. Congratulations on successful weight loss.   5.  No alcohol use.   6.  Any questions or concerns please notify our office.

## 2018-05-13 NOTE — Unmapped (Signed)
[facility] LIVER CENTER    Alba Destine, [name]  Professor of Medicine  Director, [facility]  [place] [facility] [place] [facility] [place]    [phone]    Candice Camp, MD  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  Chronic hepatitis C genotype 1 a.    Present illness: Patient is a 62 y.o. Caucasian male with chronic hepatitis C genotype 1A who presents again today for reevaluation and management of HCV.  He was initially seen in consultation by Dr. Sharon Mt on 11/11/2017.  From his note as well as Park Breed, clinical pharmacist with our department, it was discussed for him to receive Eplcusa x 12 weeks through MPAP. Today he states he has still not been able to start DAA therapy.  He still has to turn in MPAP forms. He advises, he forgot to bring the form today. He will bring the form by early next week.     He presents alone today.  He advises he has been doing very well since his last visit on 03/11/2018.  Additionally, he has not been scheduled for endoscopy evaluation as recommended last visit. Colonoscopy for CRC screening and EGD for variceal screening. Advises, he needs the number to scheduling again. He is under the care of wound care center. Medical history significant for venous stasis. + leg wound involving right lower leg. + dressing in place. Additionally, his medical history is significant for HTN and opioid abuse (remission x 15 years).     He was diagnosed with HCV approximately 2 1/2 years ago when he was hospitalized for pneumonia. The patient had a period of intravenous drug use between 1989 and 1999 (heroin, dilaudid, and morphine).  He never had acute icteric hepatitis. Overall he feels he has been doing well. His biggest concern is continued battling of venous stasis. He denies nausea, vomiting, abdominal pain, chest pain, or shortness of breath.  He has not had any GI bleeding, mental confusion, jaundice or pruritis. Voluntary weight loss now of 31 pounds over the past six months to one year.     ROS: All systems reviewed and negative except in HPI.     Past medical history:  Active Ambulatory Problems     Diagnosis Date Noted   ??? Atrial fibrillation (CMS-HCC) 08/13/2012   ??? Cirrhosis of liver (CMS-HCC) 12/12/2011   ??? Chronic hepatitis C (CMS-HCC) 08/13/2012   ??? Opiate abuse, episodic (CMS-HCC) 08/13/2012   ??? Thrombocytopenia (CMS-HCC) 11/06/2011     Resolved Ambulatory Problems     Diagnosis Date Noted   ??? No Resolved Ambulatory Problems     Past Medical History:   Diagnosis Date   ??? Cirrhosis (CMS-HCC)    ??? Hepatitis C    ??? Hypertension    ??? Opioid use disorder, severe, in sustained remission (CMS-HCC)    ??? Venous stasis dermatitis of both lower extremities    No history of diabetes, coronary artery disease, or lung disease.    Liver Section:   1.  FIBROSCAN 10/2017: 33.3 kPa consistent with stage F4 cirrhosis  2.  Chronic hepatitis C, treatment naive, genotype 1A  HCV RNA 03/11/2018 - 779,747 IU/mL    3.  + immunity hepatitis A and B.  4.  HIV - non reactive   5.  Variceal screening: Pending being scheduled for EGD.  6. Colon cancer screening: Pending being scheduled for colonoscopy.   7. HCC screening: MRI of abdomen 12/08/2017 - Cirrhotic liver morphology with 1 fissures, left hepatic lobe and caudate hypertrophy. Scattered subcentimeter simple hepatic cysts. No lesions with arterial enhancement, washout, +/- pseudocapsule to suggest hepatocellular carcinoma. + Splenomegaly with no evidence of ascites.    Multiple subcentimeter retrocrural, periportal, gastrohepatic, and retroperitoneal lymph nodes. For reference: A 1.2 cm porta hepatis lymph node. Large lower abdominal ventral hernia with mesenteric fat, mesenteric varices, and wide neck measuring approximately 4.8 cm (8:62). Rectus diastases.     8. MELD-Na score: 9 at 03/11/2018 11:07 AM  MELD score: 9 at 03/11/2018 11:07 AM  Calculated from:  Serum Creatinine: 0.75 mg/dL (Rounded to 1 mg/dL) at 0/86/5784 69:62 AM  Serum Sodium: 139 mmol/L (Rounded to 137 mmol/L) at 03/11/2018 11:07 AM  Total Bilirubin: 1.2 mg/dL at 9/52/8413 24:40 AM  INR(ratio): 1.23 at 03/11/2018 11:07 AM  Age: 20 years    Allergies   Allergen Reactions   ??? Penicillins Hives       Current Outpatient Medications   Medication Sig Dispense Refill   ??? hydroCHLOROthiazide (HYDRODIURIL) 25 MG tablet Take 25 mg by mouth daily.   5   ??? methadone (DOLOPHINE) 10 mg/5 mL solution Take 100 mg by mouth daily.      ??? potassium chloride (KLOR-CON) 10 MEQ CR tablet Take 10 mEq by mouth daily.  5   ??? sofosbuvir-velpatasvir (EPCLUSA) tablet Take 1 tablet by mouth daily. (Patient not taking: Reported on 05/13/2018) 28 tablet 2     No current facility-administered medications for this visit.      Social history: Patient is not married.  He has no children. He works as an Scientist, water quality.  He smokes a half a pack of cigarettes per day.  He formerly drank every day (audit score equals 8).  He discontinued all alcohol as of October 13, 2017.    Family history: Negative for liver disease or liver cancer. Unremarkable family history per patient.     Physical Examination:   BP 158/77  - Pulse 81  - Temp 36.9 ??C (98.4 ??F) (Oral)  - Resp 16  - Ht 180.3 cm (5' 10.98)  - Wt (!) 136.1 kg (300 lb 1.6 oz)  - SpO2 95%  - BMI 41.87 kg/m??   General: Pleasant individual in NAD, morbidly obese  HEENT: Sclera are anicteric, no temporal muscle loss, oropharynx is negative  NECK: No thyromegaly or lymphadenopathy, No carotid bruits  Chest: Clear to auscultation and percussion  Heart: S1, S2, RR, No murmurs  GI: Deferred. Recently performed 02/2018  Skin: +spider angiomata, No rashes  Extremities: Without pedal edema, no palmar erythema  Neuro: Grossly intact, No focal deficits    Laboratory studies:  Laboratory studies were not ordered today as laboratory studies up to date ~ Performed May 2019. Reviewed most recent results.     Impression/Plan:  1.  Chronic hepatitis C genotype 1 A : Patient appears to have cirrhosis based upon physical examination, laboratory data, and FIBROSCAN results. Imaging findings consistent with portal hypertension. History of  thrombocytopenia which also suggested portal hypertension and along with decreased albumin suggesting possible evidence of borderline decompensation. The patient is a good candidate for antiviral therapy with the regimen that does not contain a protease inhibitor, given that he may have borderline hepatic decompensation. Recommendation is Epclusa x 12 weeks. Unfortunately, patient has still not completed and turned in MPAP form.  We have wanted him to proceed with endoscopic evaluation prior to initiation of DAA therapy, which has not been scheduled yet.      2.  HCC surveillance: Up to date. [place] surveillance every six months minimal. MRI Abd with MRCP ordered for first of September. Recommend MRCP being performed based on previous finding of pancreatic cysts.     3.  Alcohol abuse: No alcohol use. AUDIT score = zero.   Congratulated on maintaining sobriety since January 2019.     4.  Risk for fatty liver disease: Patient is morbidly obese and likely has an element of fatty liver disease contributing to his cirrhosis.  He continues to be successful with weight loss over the last several months and I have congratulated him in this regard. He should continue to work on weight loss in order to maintain liver health as well.    5. Portal hypertension: Pending EGD to be performed. Scheduling number given to patient to call. Stressed on importance.     6. Colon cancer screening: Order has been placed for screening colonoscopy ~ pending being scheduled.     7. Positive immunity to both hepatitis A and B.     8.  HCV treatment: Treatment goal of Epclusa x 12 weeks. Patient will return to our clinic on Monday with MPAP form. Stress grave importance of getting form completed allowing for [place] to be able to proceed forward with txment. Patient voiced understanding.     All patient's questions were answered to his satisfaction during office visit today.     Rodman Key, DNP, FNP-BC  [facility] Liver Program  [address]  [place] [address]  Phone [phone]

## 2018-05-18 NOTE — Unmapped (Signed)
Patient returned to clinic to drop off his Manufacturer Patient Assistance Application documents. Forwarded his documents to [facility] MAP team to reopen referral.

## 2018-05-20 NOTE — Unmapped (Signed)
Patient has been approved to receive EPCLUSA from the manufacturer from  05/19/18 until 08/12/18 and will be shipped directly to the patient's home.

## 2018-05-24 NOTE — Unmapped (Signed)
Initial Counseling for HCV Treatment     Planned regimen: Epclusa (sofosbuvir/velpatasvir 400/100mg ) x 12 weeks  Planned start date: scheduled to be delivered tomorrow, 05/25/18, and will plan to start treatment 05/26/18.    Pharmacy: [facility] [phone]    PMH:  Past Medical History:   Diagnosis Date   ??? Cirrhosis (CMS-HCC)    ??? Hepatitis C    ??? Hypertension    ??? Opioid use disorder, severe, in sustained remission (CMS-HCC)    ??? Venous stasis dermatitis of both lower extremities      Current medications: Potassium chloride, HCTZ, methadone    Patient is ready to start treatment with Epclusa.     Following topics were discussed during counseling:   Patient Counseling    Counseled the patient on the following:  doses and administration discussed, possible adverse effects and management discussed, possible drug and prescription drug interactions discussed, possible drug and OTC drug and food interactions discussed, lab monitoring and follow-up discussed, therapeutic rationale discussed, adherence and missed doses discussed, pharmacy contact information discussed          1. Indications for medication, dosage and administration.     A. Epclusa 400/100mg  1 tablet to take daily with or without food. Patient plans to take Epclusa at 5am every morning with breakfast.  Pt notes that he already utilizes an alarm and takes his other medications at that time.     2. Common side effects of medications and management strategies. (fatigue, headache) Confirmed pt is no longer taking naproxen for pain.  Counseled patient that he may take up to 2,000mg /day of Tylenol for headaches and to ensure he is drinking plenty of water throughout the day.      3. Importance of adherence to regimen, follow-up clinic visits and lab monitoring.   Medication Adherence    Specialty Medication:  Epclusa   Patient is on additional specialty medications:  No  Patient is on more than two specialty medications:  No  Informant:  patient  Patient is at risk for Non-Adherence:  No  Reasons for non-adherence:  no problems identified  Adherence tools used:  alarm       A. Asked patient to call Lavonna Rua [phone] to establish start date for treatment and to schedule appointment 4 weeks before starting treatment.      4. Drug-drug interaction. Confirmed pt's medications.  Pt is no longer taking clonidine, furosemide or naproxen that was documented on 11/11/17.  Pt denies any OTC products/supplements/herbal medications.   Drug Interactions    Drug interactions evaluated:  yes  Clinically relevant drug interactions identified:  no  Provided the patient with educational material regarding drug interactions:  not applicable         A. Current medications have been reviewed and assessed for possible interaction.   Educated patient the mechanism of drug-drug interaction with acid lowering agents. Pt denies any acid lowering agents, and says he has never had to use any heartburn medications. Advised pt to check with MD or pharmacist before taking any OTC/herbal medications, with emphasis regarding indigestion/heartburn medications.  Denies use of herbal medication such as milk thistle or [place]. John's wart.  Allergies have been verified. Pt denies any alcohol consumption and was encouraged to stay abstinent.     5. Importance of informing pharmacy and clinic of updated contact information.  Pt says he has been contacted by [facility] and his Dorita Fray is scheduled to be delivered tomorrow, 05/25/18, and will plan to start treatment 05/26/18.  Advised patient to call pharmacy when down to about 7 day supply left to ensure there's no interruption in therapy.      Patient verbalized understanding. Provided contact information for any questions/concerns.     Casimer Bilis, PharmD Candidate  May 24, 2018 1:18 PM      Park Breed, Loura Back., BCPS, BCGP, CPP  [facility]  [address]  [place], [place] [address]  [phone]

## 2018-06-07 NOTE — Unmapped (Signed)
Follow-Up Counseling for HCV Treatment      Regimen: Epclusa (sofosbuvir/velpatasvir 400/100mg ) x 12 weeks  Start Date: 05/26/18  Completed Treatment Week #1    Pharmacy: [phone]    Following topics were reviewed during the phone call:  Patient Counseling    Counseled the patient on the following:  doses and administration discussed, possible adverse effects and management discussed, possible drug and OTC drug and food interactions discussed, lab monitoring and follow-up discussed, therapeutic rationale discussed, adherence and missed doses discussed, pharmacy contact information discussed         1. Medication administration - Takes Epclusa every morning at 5am.    2. Importance of adherence -   Medication Adherence    Patient reported X missed doses in the last month:  0  Specialty Medication:  Epclusa  Patient is on additional specialty medications:  No  Patient is on more than two specialty medications:  No  Any gaps in refill history greater than 2 weeks in the last 3 months:  no  Demonstrates understanding of importance of adherence:  yes  Informant:  patient  Reliability of informant:  reliable  Patient is at risk for Non-Adherence:  No  Reasons for non-adherence:  no problems identified  Adherence tools used:  alarm     Pt denies any missed doses, stressed importance of continuing to stay adherent to [facility] treatment.  Pt was at work and unable to give an exact pill count, asked pt to call back with pill count and that he may leave a message, pt agreed and verbalized understanding.  Provided contact information.      3. Side effects - No new side effects to report.  Pt said the first few days of starting Epclusa he had minor headaches, in which he would take 3 tablets/day of Tylenol Extra Strength.  Informed pt not to exceed 2,000mg /day of Tylenol which is 4 Tylenol Extra Strength tablets.  Pt notes that headache symptoms have subsided on its own after a week.  Encouraged pt to drink plenty of water throughout the day.  Adverse Effects    *All other systems reviewed and are negative         4. Drug-drug interaction -  Pt denies any alcohol consumption.  No new medications/OTC/herbal products.   Drug Interactions    Drug interactions evaluated:  yes  Clinically relevant drug interactions identified:  no  Provided the patient with educational material regarding drug interactions:  not applicable         5. Follow up - Has follow up appointment scheduled in HCV treatment clinic on 06/24/18 with Owens Shark, DNP.  Advised patient to call pharmacy when down to about 7 day supply left to ensure there's no interruption in therapy.        All questions were answered.  Pt verbalized understanding.    Casimer Bilis, PharmD Candidate  June 07, 2018 12:32 PM    Park Breed, Pharm D., BCPS, BCGP, CPP  [facility]  [address]  [place], [place] [address]  [phone]

## 2018-06-22 ENCOUNTER — Ambulatory Visit: Admit: 2018-06-22 | Discharge: 2018-06-22

## 2018-06-22 DIAGNOSIS — K869 Disease of pancreas, unspecified: Secondary | ICD-10-CM

## 2018-06-22 DIAGNOSIS — K7460 Unspecified cirrhosis of liver: Principal | ICD-10-CM

## 2018-07-15 ENCOUNTER — Ambulatory Visit: Admit: 2018-07-15 | Discharge: 2018-07-15 | Attending: Family | Primary: Family

## 2018-07-15 DIAGNOSIS — B182 Chronic viral hepatitis C: Principal | ICD-10-CM

## 2018-07-15 DIAGNOSIS — Z23 Encounter for immunization: Secondary | ICD-10-CM

## 2018-07-15 NOTE — Unmapped (Addendum)
1.  Laboratory studies ordered today as part of continued hepatitis C care.   2.  Office follow up five weeks.   3.  Continue taking Epclusa one tablet daily. Avoid missing any doses.   4.  Increase water intake.   5.  Okay to take Tylenol up to 2,000 mg total daily as needed for headaches.   6.  Exchange toothbrushes, razors, etc during treatment.   7.  Flu vaccine administered today.

## 2018-07-15 NOTE — Unmapped (Signed)
[facility]    Colin Valentine, [name]  Professor of Medicine  Director, [facility]  [place] [facility] [place] [facility] [place]    [phone]    Colin Camp, MD  [address]  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 Caucasian male with chronic hepatitis C genotype 1A who presents today for treatment follow up. He has adhered to taking Epclusa one tablet daily at 5 AM. Denies having missed any doses of treatment. Denies any alcohol use or anti acid medications. He has experienced minor headaches but overall he feels he has been doing well with the medication.     He presents alone today.  He is anxious about getting back to work. He advises, he is the only one as everyone else is out on vacation. Does express the need for EKG for his MD at methadone clinic. Methadone dose recently increased to 110 mg daily. Overall, he presents asymptomatic except for minor headaches. See prior office notes surrounding authorization process for patient to receive DAA treatment.     Colonoscopy for CRC screening and EGD for variceal screening have been ordered. Patient has still not been scheduled. He was given GI scheduling number last visit and instructed to call. He remains under the care of wound care center. Medical history significant for venous stasis.  Additionally, his medical history is significant for HTN and opioid abuse (remission x 15 years).     He was diagnosed with HCV approximately 2 1/2 years ago when he was hospitalized for pneumonia. The patient had a period of intravenous drug use between 1989 and 1999 (heroin, dilaudid, and morphine).  He never had acute icteric hepatitis. Overall he feels he has been doing well. His biggest concern remains battling of venous stasis. Denies nausea, vomiting, abdominal pain, chest pain, or shortness of breath. Denies any GI bleeding symptoms, mental confusion, jaundice or pruritis. He had experienced successful voluntary weight loss 31 pounds over the past year. Today noted 13 pound weight gain.     ROS: All systems reviewed and negative except in HPI.     Past medical history:  Active Ambulatory Problems     Diagnosis Date Noted   ??? Atrial fibrillation (CMS-HCC) 08/13/2012   ??? Cirrhosis of liver (CMS-HCC) 12/12/2011   ??? Chronic hepatitis C (CMS-HCC) 08/13/2012   ??? Opiate abuse, episodic (CMS-HCC) 08/13/2012   ??? Thrombocytopenia (CMS-HCC) 11/06/2011     Resolved Ambulatory Problems     Diagnosis Date Noted   ??? No Resolved Ambulatory Problems     Past Medical History:   Diagnosis Date   ??? Cirrhosis (CMS-HCC)    ??? Hepatitis C    ??? Hypertension    ??? Opioid use disorder, severe, in sustained remission (CMS-HCC)    ??? Venous stasis dermatitis of both lower extremities    No history of diabetes, coronary artery disease, or lung disease.    Liver Section:   1.  FIBROSCAN 10/2017: 33.3 kPa consistent with stage F4 cirrhosis  2.  Chronic hepatitis C, treatment naive, genotype 1A  HCV RNA 03/11/2018 - 779,747 IU/mL    3.  + immunity hepatitis A and B.  4.  HIV - non reactive   5.  Variceal screening: Pending being scheduled for EGD.   6. Colon cancer screening: Pending being scheduled for colonoscopy.   7. HCC screening: MRI of abdomen 06/22/2018- Nodular hepatic contour with left hepatic lobe hypertrophy. Redemonstrated subcentimeter scattered hepatic cysts with no evidence of arterially enhancing foci. No intrahepatic biliary ductal dilatation. Mild nonspecific prominence of the common bile duct measuring up to 1.1 cm. Gallbladder is distended with multiple internal stones, similar to prior. No evidence of cholecystitis. + splenomegaly. No ascites.      8. MELD-Na score: 9 at 03/11/2018 11:07 AM  MELD score: 9 at 03/11/2018 11:07 AM  Calculated from:  Serum Creatinine: 0.75 mg/dL (Rounded to 1 mg/dL) at 1/61/0960 45:40 AM  Serum Sodium: 139 mmol/L (Rounded to 137 mmol/L) at 03/11/2018 11:07 AM  Total Bilirubin: 1.2 mg/dL at 9/81/1914 78:29 AM  INR(ratio): 1.23 at 03/11/2018 11:07 AM  Age: 65 years    Allergies   Allergen Reactions   ??? Penicillins Hives       Current Outpatient Medications   Medication Sig Dispense Refill   ??? hydroCHLOROthiazide (HYDRODIURIL) 25 MG tablet Take 25 mg by mouth daily.   5   ??? methadone (DOLOPHINE) 10 mg/5 mL solution Take 110 mg by mouth daily.      ??? potassium chloride (KLOR-CON) 10 MEQ CR tablet Take 10 mEq by mouth daily.  5   ??? sofosbuvir-velpatasvir (EPCLUSA) tablet Take 1 tablet by mouth daily. 28 tablet 2   ??? sofosbuvir-velpatasvir (EPCLUSA) tablet TAKE 1 TABLET BY MOUTH DAILY 28 each 2     No current facility-administered medications for this visit.      Social history: Patient is not married.  He has no children. He works as an Scientist, water quality.  He smokes a half a pack of cigarettes per day.  He formerly drank every day (audit score equals 8).  He discontinued all alcohol as of October 13, 2017.    Family history: Negative for liver disease or liver cancer. Unremarkable family history per patient.     Physical Examination:   BP 145/75  - Pulse 81  - Temp 37.2 ??C (99 ??F) (Oral)  - Resp 16  - Ht 180.3 cm (5' 10.98)  - Wt (!) 142.2 kg (313 lb 9.6 oz)  - SpO2 96%  - BMI 43.76 kg/m??   General: Pleasant individual in NAD, morbidly obese  HEENT: Sclera are anicteric, no temporal muscle loss, lazy eye OD  Neuro: Grossly intact, No focal deficits  Rest of PE deferred    Laboratory studies:  Laboratory studies ordered, but appears patient did not present to CP2 for his labs to be drawn.     Impression/Plan:  1.  Chronic hepatitis C genotype 1 A:  Colin Valentine is a 62 yo Caucasian gentleman who presents today for treatment follow up ~ TW #8.  He has adhered to taking one tablet of Epclusa daily. Denies having missed any doses. No alcohol use or anti acid medications. He has experienced minor headaches. He did increase his water intake. 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     ~ HCV safety labs ordered.   ~ Continue Epclusa one tablet daily. Avoid missing any doses.   ~ No alcohol use or anti acid medications during treatment.   ~ Increase water intake.   ~ Ok Tylenol up to 2,000 mg total daily prn.   ~ Exchange toothbrushes, razors, etc during treatment    2.  HCC surveillance: Up to date.  Warrant surveillance every six months minimal. Patient to be scheduled for MRI Abd with MRCP in future based on previous finding of pancreatic cysts. Allow for continued monitoring of this finding too.     3.  Alcohol abuse: No alcohol use. AUDIT score = zero.   Again congratulated on maintaining sobriety since January 2019.     4.  Risk for fatty liver disease: Patient is morbidly obese and likely has an element of fatty liver disease contributing to his cirrhosis.  Unfortunately noted weight gain from last visit. He continues to be successful with weight loss over the last several months and I have congratulated him in this regard. He should continue to work on weight loss in order to maintain liver health as well.    5. Portal hypertension: EGD to be scheduled. Pending patient to call GI scheduling to set up date and time. Stressed on importance.     6. Colon cancer screening: Order has been placed for screening colonoscopy ~ pending being scheduled.     7. Positive immunity to both hepatitis A and B.     8.  Flu vaccine administered today.     All patient's questions were answered to his satisfaction during office visit today.     Colin Key, DNP, FNP-BC  [facility]  [phone] [place] [address]  [place] [address]  Phone [phone]

## 2018-07-16 NOTE — Unmapped (Addendum)
-----   Message from [facility] [place] sent at 07/16/2018 11:32 AM EDT -----  Regarding: labs  Please call patient and ask him to have his laboratory studies drawn on Monday when he is seen by vascular surgery.  It appears he did not present to CP2 yesterday as instructed?     Dawn   ___________________________________________________________________    07/16/18 11:53 I spoke with Mr Lengacher about the ordered lab work from yesterday. He told me he couldn't find CP2 and was running late for another appointment. He is going to get the labs done on Monday after his Vascular appointment at [facility].

## 2018-07-19 ENCOUNTER — Ambulatory Visit: Admit: 2018-07-19 | Discharge: 2018-07-20 | Attending: Vascular Surgery | Primary: Vascular Surgery

## 2018-07-19 DIAGNOSIS — R600 Localized edema: Principal | ICD-10-CM

## 2018-07-19 NOTE — Unmapped (Signed)
Surepress High Compression Bandage    Surepress is an elastic wrap that is used to prevent swelling in the lower legs. Preventing swelling can help heal wounds and reduce pain in your lower legs if applied correctly. Red the following instructions carefully and call our clinic if you have any questions.     Surepress bandages have a yellow center line with two different sized rectangles along this line.    Your nurse will demonstrate how to use the rectangles as a guide. They will help you determine stretch.    Use the yellow line running down the middle of the wrap as a guide to overlap bandage 50%.    Begin Wrapping:    ?? Wrap the bandage 1 1/2 times around the foot with the front edge of the wrap at the base of the toes.  ?? Continue wrapping to the back of the foot.  ?? Bring the wrap up and around the ankle.  ?? Now bring the wrap all the way around to cover the heel.  ?? Continuing wrapping up the leg in a spiral fashion until you are 1 inch below the bend of the knee.    Apply Surepress when you wake up. DO NOT wait to apply this wrap because your leg will swell as you do your morning activities. Take it off at night before you go to bed. Rewrap if the Surepress becomes loose during the day.    Surepress can be washed in cold water up to 20 times. DO NOT put them in the dryer. Hand them up to air dry.    CAUTION! If you have any of the symptoms listed below, take off the Surepress immediately and rewrap.    ?? If the wrap feels so tight that you think it is cutting off the circulation to your toes, remove the wrap.  ?? If your toes are turning a blue-gray color and they feel cold to the touch, remove the wrap.  ?? If the wrap has slid down significantly. If it is bunching around your ankle and it feels like the wrap is cutting into the skin, remove the wrap.  ?? If the Surepress wrap is causing pain that is not relieved by elevating the leg to about the level of the heart, remove the wrap.    [facility] [phone]  and ask to speak to a nurse if you have questions.

## 2018-07-19 NOTE — Unmapped (Signed)
VASCULAR SURGERY NEW PATIENT CLINIC NOTE      ASSESSMENT/PLAN:  Colin Valentine is a 62 y.o. male with PMH of cirrhosis 2/2 HCV, HTN, and BLE ulcers. We do not have any venous imaging, but the appearance of the ulcerations suggests venous disease. He has not tried compression therapy yet. We will plan to start compression therapy and CVI imaging with a duplex. RTC in 6 weeks.     HPI:  We are seeing Colin Valentine at the request of Dr. Jaymes Graff for evaluation of BLE ulcerations. He is a 62 y.o. male with PMH of cirrhosis 2/2 HCV, HTN, and BLE ulcers. He's had skin discoloration of his legs since he's been 62 years old and his legs started to swell shortly thereafter. He does the best he can to keep his legs elevated, but he works and is on his feet all day long. He denies open wounds, but they do weep fluid. Denies fevers/chills. He has never tried compression stockings. He tried ace wrap dressings once, when they were placed by his PCP, but he did not continued with those.       PAST MEDICAL HISTORY:  Past Medical History:   Diagnosis Date   ??? Cirrhosis (CMS-HCC)    ??? Hepatitis C    ??? Hypertension    ??? Opioid use disorder, severe, in sustained remission (CMS-HCC)    ??? Venous stasis dermatitis of both lower extremities          PAST SURGICAL HISTORY:  Past Surgical History:   Procedure Laterality Date   ??? ABDOMINAL HERNIA REPAIR           ALLERGIES:  Allergies as of 07/19/2018 - Reviewed 07/19/2018   Allergen Reaction Noted   ??? Penicillins Hives 11/11/2017         MEDICATIONS:  has a current medication list which includes the following prescription(s): hydrochlorothiazide, methadone, potassium chloride, sofosbuvir-velpatasvir, and sofosbuvir-velpatasvir.       SOCIAL HISTORY   reports that he has quit smoking. He smoked 0.25 packs per day. He has never used smokeless tobacco. He reports that he does not drink alcohol or use drugs.      FAMILY HISTORY  No family history on file.      REVIEW OF SYSTEMS:  Ten-systems reviewed and were found to be negative except for the aforementioned complaints in the HPI      PHYSICAL EXAMINATION:   BP 128/64  - Pulse 75  - Temp 37.3 ??C (Oral)  - Wt (!) 141.3 kg (311 lb 8 oz)  - BMI 43.47 kg/m??   General: Sitting in chair, chronically ill in appearance  Resp:  Non labored breathing on RA  CV:  Normal rate.   Abd:  NT ND  Extremities: 2+ pitting edema of BLE. Hyperchromic skin changes to the knees bilaterally. No wounds, but serous drainage from right leg.   Pulses: Palpable dp pulses bilaterally  Neuro:  Grossly intact  Psych:  Mood and affect appropriate and congruent      I saw and evaluated the patient, participating in the key portions of the service.  I reviewed the resident???s note.  I agree with the resident???s findings and plan.

## 2018-08-05 NOTE — Unmapped (Signed)
Follow-Up Counseling for HCV Treatment      Regimen: Epclusa (sofosbuvir/velpatasvir 400/100mg ) x 12 weeks  Start Date: 05/26/18  Completed Treatment Week #10    Pharmacy: [facility] [phone]    Following topics were reviewed during the phone call:  Patient Counseling    Counseled the patient on the following:  doses and administration discussed, possible adverse effects and management discussed, possible drug and prescription drug interactions discussed, possible drug and OTC drug and food interactions discussed, lab monitoring and follow-up discussed, therapeutic rationale discussed, adherence and missed doses discussed, pharmacy contact information discussed         1. Medication administration - Takes Epclusa every morning at 5:00 am.     2. Importance of adherence -   Medication Adherence    Patient reported X missed doses in the last month:  0  Specialty Medication:  Epclusa  Patient is on additional specialty medications:  No  Patient is on more than two specialty medications:  No  Any gaps in refill history greater than 2 weeks in the last 3 months:  no  Demonstrates understanding of importance of adherence:  yes  Informant:  patient  Reliability of informant:  reliable  Provider-estimated medication adherence level:  90-100%  Patient is at risk for Non-Adherence:  No  Reasons for non-adherence:  no problems identified      Adherence tools used:  alarm          Confirmed plan for next specialty medication refill:  outside pharmacy       Pill count over the phone revealed #12 tablets which is appropriate.    3. Side effects - No new side effects to report. Pt stated his headaches have gone away.   Adverse Effects        *All other systems reviewed and are negative         4. Drug-drug interaction -  Pt denies starting any new medications. Pt denies any alcohol use. .  Drug Interactions    Drug interactions evaluated:  yes  Clinically relevant drug interactions identified:  no                 5. Follow up - Has follow up appointment scheduled in HCV treatment clinic on 08/26/18@ 8:30 with Owens Shark, DNP. Informed patient that there is still a small chance of relapse after finishing the treatment. Stressed importance of follow up 3 months post treatment to assess for cure.      All questions were answered.      Vertell Limber RN, [facility]   Pharmacy Adult GI Medicine  [facility]  [address]   [place], [place] [address]  [phone]    August 05, 2018 8:41 AM

## 2018-08-10 ENCOUNTER — Ambulatory Visit: Admit: 2018-08-10 | Discharge: 2018-08-11

## 2018-08-10 DIAGNOSIS — R718 Other abnormality of red blood cells: Secondary | ICD-10-CM

## 2018-08-10 DIAGNOSIS — B182 Chronic viral hepatitis C: Secondary | ICD-10-CM

## 2018-08-10 DIAGNOSIS — D649 Anemia, unspecified: Principal | ICD-10-CM

## 2018-08-10 LAB — HEPATIC FUNCTION PANEL
ALKALINE PHOSPHATASE: 97 U/L (ref 38–126)
AST (SGOT): 31 U/L (ref 19–55)
BILIRUBIN DIRECT: 0.2 mg/dL (ref 0.00–0.40)
BILIRUBIN TOTAL: 1 mg/dL (ref 0.0–1.2)

## 2018-08-10 LAB — CBC W/ AUTO DIFF
BASOPHILS ABSOLUTE COUNT: 0.1 10*9/L (ref 0.0–0.1)
BASOPHILS RELATIVE PERCENT: 2.1 %
EOSINOPHILS ABSOLUTE COUNT: 0.2 10*9/L (ref 0.0–0.4)
EOSINOPHILS RELATIVE PERCENT: 4.8 %
HEMATOCRIT: 33.7 % — ABNORMAL LOW (ref 41.0–53.0)
HEMOGLOBIN: 11.4 g/dL — ABNORMAL LOW (ref 13.5–17.5)
LYMPHOCYTES ABSOLUTE COUNT: 1 10*9/L — ABNORMAL LOW (ref 1.5–5.0)
MEAN CORPUSCULAR HEMOGLOBIN CONC: 33.7 g/dL (ref 31.0–37.0)
MEAN CORPUSCULAR HEMOGLOBIN: 35.7 pg — ABNORMAL HIGH (ref 26.0–34.0)
MEAN CORPUSCULAR VOLUME: 105.7 fL — ABNORMAL HIGH (ref 80.0–100.0)
MEAN PLATELET VOLUME: 7.8 fL (ref 7.0–10.0)
MONOCYTES ABSOLUTE COUNT: 0.3 10*9/L (ref 0.2–0.8)
MONOCYTES RELATIVE PERCENT: 7.6 %
NEUTROPHILS ABSOLUTE COUNT: 1.9 10*9/L — ABNORMAL LOW (ref 2.0–7.5)
PLATELET COUNT: 58 10*9/L — ABNORMAL LOW (ref 150–440)
RED BLOOD CELL COUNT: 3.18 10*12/L — ABNORMAL LOW (ref 4.50–5.90)
RED CELL DISTRIBUTION WIDTH: 14.7 % (ref 12.0–15.0)
WBC ADJUSTED: 3.4 10*9/L — ABNORMAL LOW (ref 4.5–11.0)

## 2018-08-10 LAB — SMEAR REVIEW

## 2018-08-10 LAB — BASIC METABOLIC PANEL
ANION GAP: 6 mmol/L — ABNORMAL LOW (ref 7–15)
BLOOD UREA NITROGEN: 14 mg/dL (ref 7–21)
BUN / CREAT RATIO: 19
CALCIUM: 8.7 mg/dL (ref 8.5–10.2)
CHLORIDE: 104 mmol/L (ref 98–107)
CO2: 31 mmol/L — ABNORMAL HIGH (ref 22.0–30.0)
EGFR CKD-EPI NON-AA MALE: >90 mL/min/{1.73_m2} (ref >=60)
GLUCOSE RANDOM: 127 mg/dL (ref 65–179)
POTASSIUM: 3.6 mmol/L (ref 3.5–5.0)
SODIUM: 141 mmol/L (ref 135–145)

## 2018-08-10 LAB — EOSINOPHILS ABSOLUTE COUNT: Lab: 0.2

## 2018-08-10 LAB — VITAMIN B-12: Cobalamins:MCnc:Pt:Ser/Plas:Qn:: 986 — ABNORMAL HIGH

## 2018-08-10 LAB — ALT (SGPT): Alanine aminotransferase:CCnc:Pt:Ser/Plas:Qn:: 16 — ABNORMAL LOW

## 2018-08-10 LAB — CO2: Carbon dioxide:SCnc:Pt:Ser/Plas:Qn:: 31 — ABNORMAL HIGH

## 2018-08-10 LAB — FOLATE: Folate:MCnc:Pt:Ser/Plas:Qn:: 13.6

## 2018-08-11 LAB — HEPATITIS C RNA, QUANTITATIVE, PCR

## 2018-08-11 LAB — HCV RNA COMMENT: Lab: 0

## 2018-08-17 DIAGNOSIS — D6959 Other secondary thrombocytopenia: Secondary | ICD-10-CM | POA: Diagnosis present

## 2018-08-17 DIAGNOSIS — R9431 Abnormal electrocardiogram [ECG] [EKG]: Secondary | ICD-10-CM | POA: Diagnosis present

## 2018-08-17 DIAGNOSIS — I10 Essential (primary) hypertension: Secondary | ICD-10-CM | POA: Insufficient documentation

## 2018-08-30 ENCOUNTER — Ambulatory Visit: Admit: 2018-08-30 | Discharge: 2018-08-30

## 2018-08-30 ENCOUNTER — Ambulatory Visit: Admit: 2018-08-30 | Discharge: 2018-08-30 | Attending: Vascular Surgery | Primary: Vascular Surgery

## 2018-08-30 DIAGNOSIS — R600 Localized edema: Principal | ICD-10-CM

## 2018-08-30 DIAGNOSIS — I872 Venous insufficiency (chronic) (peripheral): Principal | ICD-10-CM

## 2018-08-30 MED ORDER — TRIAMCINOLONE ACETONIDE 0.1 % TOPICAL CREAM
Freq: Every day | TOPICAL | 0 refills | 0.00000 days | Status: CP
Start: 2018-08-30 — End: 2018-12-22

## 2018-10-01 ENCOUNTER — Ambulatory Visit: Admit: 2018-10-01 | Discharge: 2018-10-02 | Attending: Cardiovascular Disease | Primary: Cardiovascular Disease

## 2018-10-01 DIAGNOSIS — R9431 Abnormal electrocardiogram [ECG] [EKG]: Principal | ICD-10-CM

## 2018-11-16 ENCOUNTER — Ambulatory Visit: Admit: 2018-11-16 | Discharge: 2018-11-17 | Attending: Family | Primary: Family

## 2018-11-16 DIAGNOSIS — K7460 Unspecified cirrhosis of liver: Principal | ICD-10-CM

## 2018-11-22 ENCOUNTER — Other Ambulatory Visit: Admit: 2018-11-22 | Discharge: 2018-11-23

## 2018-11-22 DIAGNOSIS — Z8619 Personal history of other infectious and parasitic diseases: Secondary | ICD-10-CM | POA: Diagnosis present

## 2018-11-22 DIAGNOSIS — K7460 Unspecified cirrhosis of liver: Principal | ICD-10-CM

## 2018-12-22 MED ORDER — TRIAMCINOLONE ACETONIDE 0.1 % TOPICAL CREAM
0 refills | 0 days | Status: CP
Start: 2018-12-22 — End: ?

## 2019-06-06 ENCOUNTER — Institutional Professional Consult (permissible substitution): Admit: 2019-06-06 | Discharge: 2019-06-07 | Attending: Family | Primary: Family

## 2019-06-06 ENCOUNTER — Ambulatory Visit: Admit: 2019-06-06 | Discharge: 2019-06-07

## 2019-06-06 DIAGNOSIS — Z8619 Personal history of other infectious and parasitic diseases: Secondary | ICD-10-CM

## 2019-06-06 DIAGNOSIS — Z1211 Encounter for screening for malignant neoplasm of colon: Secondary | ICD-10-CM

## 2019-06-06 DIAGNOSIS — K7460 Unspecified cirrhosis of liver: Principal | ICD-10-CM

## 2020-04-03 DIAGNOSIS — E538 Deficiency of other specified B group vitamins: Secondary | ICD-10-CM | POA: Diagnosis present

## 2021-02-20 ENCOUNTER — Ambulatory Visit: Admit: 2021-02-20 | Discharge: 2021-02-21 | Disposition: A | Discharge status: Home / Self Care

## 2021-02-20 ENCOUNTER — Emergency Department: Admit: 2021-02-20 | Discharge: 2021-02-21 | Disposition: A | Discharge status: Home / Self Care

## 2021-02-20 DIAGNOSIS — K460 Unspecified abdominal hernia with obstruction, without gangrene: Principal | ICD-10-CM

## 2021-02-28 ENCOUNTER — Other Ambulatory Visit: Payer: Self-pay

## 2021-02-28 ENCOUNTER — Inpatient Hospital Stay
Admission: EM | Admit: 2021-02-28 | Discharge: 2021-03-08 | DRG: 435 | Disposition: A | Discharge status: Hospice | Payer: Self-pay | Attending: Internal Medicine | Admitting: Internal Medicine

## 2021-02-28 DIAGNOSIS — Z66 Do not resuscitate: Secondary | ICD-10-CM | POA: Diagnosis not present

## 2021-02-28 DIAGNOSIS — B9620 Unspecified Escherichia coli [E. coli] as the cause of diseases classified elsewhere: Secondary | ICD-10-CM | POA: Diagnosis present

## 2021-02-28 DIAGNOSIS — D72810 Lymphocytopenia: Secondary | ICD-10-CM | POA: Diagnosis present

## 2021-02-28 DIAGNOSIS — F1020 Alcohol dependence, uncomplicated: Secondary | ICD-10-CM | POA: Diagnosis present

## 2021-02-28 DIAGNOSIS — Z20822 Contact with and (suspected) exposure to covid-19: Secondary | ICD-10-CM | POA: Diagnosis present

## 2021-02-28 DIAGNOSIS — L03116 Cellulitis of left lower limb: Secondary | ICD-10-CM | POA: Diagnosis present

## 2021-02-28 DIAGNOSIS — R296 Repeated falls: Secondary | ICD-10-CM | POA: Diagnosis present

## 2021-02-28 DIAGNOSIS — Z23 Encounter for immunization: Secondary | ICD-10-CM

## 2021-02-28 DIAGNOSIS — Z880 Allergy status to penicillin: Secondary | ICD-10-CM

## 2021-02-28 DIAGNOSIS — D6959 Other secondary thrombocytopenia: Secondary | ICD-10-CM | POA: Diagnosis present

## 2021-02-28 DIAGNOSIS — K766 Portal hypertension: Secondary | ICD-10-CM | POA: Diagnosis present

## 2021-02-28 DIAGNOSIS — R609 Edema, unspecified: Secondary | ICD-10-CM

## 2021-02-28 DIAGNOSIS — N390 Urinary tract infection, site not specified: Secondary | ICD-10-CM | POA: Diagnosis present

## 2021-02-28 DIAGNOSIS — E876 Hypokalemia: Secondary | ICD-10-CM

## 2021-02-28 DIAGNOSIS — K921 Melena: Secondary | ICD-10-CM

## 2021-02-28 DIAGNOSIS — E538 Deficiency of other specified B group vitamins: Secondary | ICD-10-CM | POA: Diagnosis present

## 2021-02-28 DIAGNOSIS — Z8619 Personal history of other infectious and parasitic diseases: Secondary | ICD-10-CM | POA: Diagnosis present

## 2021-02-28 DIAGNOSIS — R160 Hepatomegaly, not elsewhere classified: Secondary | ICD-10-CM

## 2021-02-28 DIAGNOSIS — R9431 Abnormal electrocardiogram [ECG] [EKG]: Secondary | ICD-10-CM | POA: Diagnosis present

## 2021-02-28 DIAGNOSIS — R161 Splenomegaly, not elsewhere classified: Secondary | ICD-10-CM | POA: Diagnosis present

## 2021-02-28 DIAGNOSIS — G2581 Restless legs syndrome: Secondary | ICD-10-CM | POA: Diagnosis present

## 2021-02-28 DIAGNOSIS — I890 Lymphedema, not elsewhere classified: Secondary | ICD-10-CM

## 2021-02-28 DIAGNOSIS — K7031 Alcoholic cirrhosis of liver with ascites: Secondary | ICD-10-CM | POA: Diagnosis present

## 2021-02-28 DIAGNOSIS — F1110 Opioid abuse, uncomplicated: Secondary | ICD-10-CM | POA: Diagnosis present

## 2021-02-28 DIAGNOSIS — K7460 Unspecified cirrhosis of liver: Secondary | ICD-10-CM | POA: Diagnosis present

## 2021-02-28 DIAGNOSIS — I81 Portal vein thrombosis: Secondary | ICD-10-CM | POA: Diagnosis present

## 2021-02-28 DIAGNOSIS — E8809 Other disorders of plasma-protein metabolism, not elsewhere classified: Secondary | ICD-10-CM | POA: Diagnosis present

## 2021-02-28 DIAGNOSIS — I10 Essential (primary) hypertension: Secondary | ICD-10-CM | POA: Diagnosis present

## 2021-02-28 DIAGNOSIS — R918 Other nonspecific abnormal finding of lung field: Secondary | ICD-10-CM

## 2021-02-28 DIAGNOSIS — C7800 Secondary malignant neoplasm of unspecified lung: Secondary | ICD-10-CM | POA: Diagnosis present

## 2021-02-28 DIAGNOSIS — Z515 Encounter for palliative care: Secondary | ICD-10-CM

## 2021-02-28 DIAGNOSIS — L03115 Cellulitis of right lower limb: Secondary | ICD-10-CM | POA: Diagnosis present

## 2021-02-28 DIAGNOSIS — D649 Anemia, unspecified: Secondary | ICD-10-CM

## 2021-02-28 DIAGNOSIS — C220 Liver cell carcinoma: Principal | ICD-10-CM | POA: Diagnosis present

## 2021-02-28 DIAGNOSIS — L8990 Pressure ulcer of unspecified site, unspecified stage: Secondary | ICD-10-CM | POA: Insufficient documentation

## 2021-02-28 DIAGNOSIS — Z87891 Personal history of nicotine dependence: Secondary | ICD-10-CM

## 2021-02-28 DIAGNOSIS — Z7189 Other specified counseling: Secondary | ICD-10-CM

## 2021-02-28 DIAGNOSIS — Z6841 Body Mass Index (BMI) 40.0 and over, adult: Secondary | ICD-10-CM

## 2021-02-28 HISTORY — DX: Personal history of other infectious and parasitic diseases: Z86.19

## 2021-02-28 HISTORY — DX: Unspecified cirrhosis of liver: K74.60

## 2021-02-28 LAB — URINALYSIS, COMPLETE (UACMP) WITH MICROSCOPIC
Bilirubin Urine: NEGATIVE
Glucose, UA: NEGATIVE mg/dL
Ketones, ur: NEGATIVE mg/dL
Nitrite: POSITIVE — AB
Protein, ur: 30 mg/dL — AB
Specific Gravity, Urine: 1.021 (ref 1.005–1.030)
pH: 5 (ref 5.0–8.0)

## 2021-02-28 LAB — CBC
HCT: 27.2 % — ABNORMAL LOW (ref 39.0–52.0)
Hemoglobin: 9.2 g/dL — ABNORMAL LOW (ref 13.0–17.0)
MCH: 33.9 pg (ref 26.0–34.0)
MCHC: 33.8 g/dL (ref 30.0–36.0)
MCV: 100.4 fL — ABNORMAL HIGH (ref 80.0–100.0)
Platelets: 70 10*3/uL — ABNORMAL LOW (ref 150–400)
RBC: 2.71 MIL/uL — ABNORMAL LOW (ref 4.22–5.81)
RDW: 16 % — ABNORMAL HIGH (ref 11.5–15.5)
WBC: 3.5 10*3/uL — ABNORMAL LOW (ref 4.0–10.5)
nRBC: 0 % (ref 0.0–0.2)

## 2021-02-28 LAB — TYPE AND SCREEN
ABO/RH(D): A POS
Antibody Screen: NEGATIVE

## 2021-02-28 LAB — MAGNESIUM: Magnesium: 2 mg/dL (ref 1.7–2.4)

## 2021-02-28 LAB — BASIC METABOLIC PANEL
Anion gap: 6 (ref 5–15)
BUN: 13 mg/dL (ref 8–23)
CO2: 24 mmol/L (ref 22–32)
Calcium: 8.1 mg/dL — ABNORMAL LOW (ref 8.9–10.3)
Chloride: 106 mmol/L (ref 98–111)
Creatinine, Ser: 0.82 mg/dL (ref 0.61–1.24)
GFR, Estimated: >60 mL/min (ref >60)
Glucose, Bld: 99 mg/dL (ref 70–99)
Potassium: 2.8 mmol/L — ABNORMAL LOW (ref 3.5–5.1)
Sodium: 136 mmol/L (ref 135–145)

## 2021-02-28 MED ORDER — SODIUM CHLORIDE 0.9 % IV BOLUS
1000.0000 mL | Freq: Once | INTRAVENOUS | Status: AC
  Administered 2021-02-28: 1000 mL via INTRAVENOUS

## 2021-02-28 MED ORDER — POTASSIUM CHLORIDE CRYS ER 20 MEQ PO TBCR
40.0000 meq | EXTENDED_RELEASE_TABLET | Freq: Once | ORAL | Status: AC
  Administered 2021-02-28: 40 meq via ORAL
  Filled 2021-02-28: qty 2

## 2021-02-28 MED ORDER — SODIUM CHLORIDE 0.9 % IV SOLN
1.0000 g | Freq: Once | INTRAVENOUS | Status: AC
  Administered 2021-02-28: 1 g via INTRAVENOUS
  Filled 2021-02-28: qty 10

## 2021-02-28 NOTE — ED Notes (Signed)
Gave patient sandwich tray and sprite.

## 2021-02-28 NOTE — ED Notes (Signed)
Patient to room 3.  Made aware of the need for urine, unable to provide at this time.    Patient on the cardiac monitor, SR.  VSS.

## 2021-02-28 NOTE — ED Provider Notes (Signed)
[facility] Emergency Department Provider Note   ____________________________________________   I have reviewed the triage vital signs and the nursing notes.   HISTORY  Chief Complaint Weakness   History limited by: Not Limited   HPI Carl Rivas is a 65 y.o. male who presents to the emergency department today because of concern for continued and worsening weakness. The patient states that his legs feel week. This has been giong on for a long time. Came into the emergency department today because his niece called 66. It is unclear that any new symptoms or significant worsening of symptoms occurred today. He also has complaints of bilateral leg wounds were he thinks he was scratching them too much. Additionally the patient has complaints of abdominal pain but states that it is unchanged from when he was evaluated for it at [facility] a little over a week ago.    Records reviewed. Per medical record review patient has a history of [facility] visit [facility] days ago. Worked up primarily for abdominal pain, CT showed chronic incarcerated hernia. Surgery was consulted at that time and did not feel any emergent or urgent intervention was required.    Prior to Admission medications   Not on File    Allergies Penicillins  No family history on file.  Social History    Review of Systems Constitutional: No fever/chills Eyes: No visual changes. ENT: No sore throat. Cardiovascular: Denies chest pain. Respiratory: Denies shortness of breath. Gastrointestinal: Positive for abdominal pain. Genitourinary: Negative for dysuria. Musculoskeletal: Positive for bilateral lower extremity edema.  Skin: Positive for wounds to bilateral legs.  Neurological: Negative for headaches, focal weakness or numbness.  ____________________________________________   PHYSICAL EXAM:  VITAL SIGNS: ED Triage Vitals  Enc Vitals Group     BP 02/28/21 1629 (!) 156/71     Pulse Rate  02/28/21 1629 94     Resp 02/28/21 1629 18     Temp 02/28/21 1629 98.4 F (36.9 C)     Temp Source 02/28/21 1629 Oral     SpO2 02/28/21 1629 97 %     Weight 02/28/21 1630 280 lb (127 kg)     Height 02/28/21 1630 5\' 10"  (1.778 m)     Head Circumference --      Peak Flow --      Pain Score 02/28/21 1630 6   Constitutional: Alert and oriented.  Eyes: Conjunctivae are normal.  ENT      Head: Normocephalic and atraumatic.      Nose: No congestion/rhinnorhea.      Mouth/Throat: Mucous membranes are moist.      Neck: No stridor. Hematological/Lymphatic/Immunilogical: No cervical lymphadenopathy. Cardiovascular: Normal rate, regular rhythm.  No murmurs, rubs, or gallops.  Respiratory: Normal respiratory effort without tachypnea nor retractions. Breath sounds are clear and equal bilaterally. No wheezes/rales/rhonchi. Gastrointestinal: Soft and tender to palpation diffusely.  Genitourinary: Deferred Musculoskeletal: Normal range of motion in all extremities. Bilateral lower extremity edema.  Neurologic:  Normal speech and language. No gross focal neurologic deficits are appreciated.  Skin:  Skin wounds to bilateral feet. No obvious surrounding erythema.  Psychiatric: Mood and affect are normal. Speech and behavior are normal. Patient exhibits appropriate insight and judgment.  ____________________________________________    LABS (pertinent positives/negatives)  CBC wbc 3.5, hgb 9.2, plt 70 BMP na 136, k 2.8, cr 0.82 UA hazy, positive nitrite, trace leukocytes, 11-20 WBC ____________________________________________   EKG  I, Nance Pear, attending physician, personally viewed and interpreted this EKG  EKG Time: 1638 Rate: 89 Rhythm: sinus rhythm with premature supraventricular complex Axis: normal Intervals: qtc 523 QRS: LVH ST changes: no st elevation Impression: abnormal ekg  ____________________________________________     RADIOLOGY  None  ____________________________________________   PROCEDURES  Procedures  ____________________________________________   INITIAL IMPRESSION / ASSESSMENT AND PLAN / ED COURSE  Pertinent labs & imaging results that were available during my care of the patient were reviewed by me and considered in my medical decision making (see chart for details).   Patient presented to the emergency department today because of concern for weakness. Patient work up here is concerning for a urinary tract infection. Patient will be given dose of IV abx here. Given patient's weakness will plan on admission for further IV abx and IVFs.   ____________________________________________   FINAL CLINICAL IMPRESSION(S) / ED DIAGNOSES  Final diagnoses:  Lower urinary tract infectious disease     Note: This dictation was prepared with Dragon dictation. Any transcriptional errors that result from this process are unintentional     Nance Pear, MD 02/28/21 [phone]

## 2021-02-28 NOTE — ED Notes (Signed)
Patient is resting comfortably. 

## 2021-02-28 NOTE — ED Triage Notes (Signed)
First Nurse Note:  C/O generalized weakness (x 1 month)   and LE swelling and edema.  Has been seen and evaluated for weakness by [facility].  VS wnl.

## 2021-02-28 NOTE — ED Triage Notes (Signed)
Pt with c/o weakness x one month, pt with c/o bilateral leg swelling with sores. Pt states he had a hernia fixed but it is still hurting him and he thinks it has come back. Pt states he is passing dark stool and there is blood in it. Pt with disheveled appearance, old torn clothes, both legs swollen with dry skins and scabs.

## 2021-03-01 ENCOUNTER — Encounter: Payer: Self-pay | Admitting: Internal Medicine

## 2021-03-01 ENCOUNTER — Observation Stay: Payer: Self-pay

## 2021-03-01 DIAGNOSIS — R9431 Abnormal electrocardiogram [ECG] [EKG]: Secondary | ICD-10-CM

## 2021-03-01 DIAGNOSIS — N390 Urinary tract infection, site not specified: Secondary | ICD-10-CM | POA: Diagnosis present

## 2021-03-01 DIAGNOSIS — E876 Hypokalemia: Secondary | ICD-10-CM

## 2021-03-01 DIAGNOSIS — D649 Anemia, unspecified: Secondary | ICD-10-CM

## 2021-03-01 DIAGNOSIS — L8990 Pressure ulcer of unspecified site, unspecified stage: Secondary | ICD-10-CM | POA: Insufficient documentation

## 2021-03-01 DIAGNOSIS — K7460 Unspecified cirrhosis of liver: Secondary | ICD-10-CM

## 2021-03-01 DIAGNOSIS — I890 Lymphedema, not elsewhere classified: Secondary | ICD-10-CM

## 2021-03-01 DIAGNOSIS — E538 Deficiency of other specified B group vitamins: Secondary | ICD-10-CM

## 2021-03-01 DIAGNOSIS — K921 Melena: Secondary | ICD-10-CM

## 2021-03-01 LAB — CBC
HCT: 24.2 % — ABNORMAL LOW (ref 39.0–52.0)
Hemoglobin: 8.2 g/dL — ABNORMAL LOW (ref 13.0–17.0)
MCH: 34.2 pg — ABNORMAL HIGH (ref 26.0–34.0)
MCHC: 33.9 g/dL (ref 30.0–36.0)
MCV: 100.8 fL — ABNORMAL HIGH (ref 80.0–100.0)
Platelets: 61 10*3/uL — ABNORMAL LOW (ref 150–400)
RBC: 2.4 MIL/uL — ABNORMAL LOW (ref 4.22–5.81)
RDW: 15.9 % — ABNORMAL HIGH (ref 11.5–15.5)
WBC: 3 10*3/uL — ABNORMAL LOW (ref 4.0–10.5)
nRBC: 0 % (ref 0.0–0.2)

## 2021-03-01 LAB — COMPREHENSIVE METABOLIC PANEL
ALT: 17 U/L (ref 0–44)
AST: 87 U/L — ABNORMAL HIGH (ref 15–41)
Albumin: 2.1 g/dL — ABNORMAL LOW (ref 3.5–5.0)
Alkaline Phosphatase: 94 U/L (ref 38–126)
Anion gap: 4 — ABNORMAL LOW (ref 5–15)
BUN: 12 mg/dL (ref 8–23)
CO2: 24 mmol/L (ref 22–32)
Calcium: 7.8 mg/dL — ABNORMAL LOW (ref 8.9–10.3)
Chloride: 110 mmol/L (ref 98–111)
Creatinine, Ser: 0.8 mg/dL (ref 0.61–1.24)
GFR, Estimated: >60 mL/min (ref >60)
Glucose, Bld: 97 mg/dL (ref 70–99)
Potassium: 3.3 mmol/L — ABNORMAL LOW (ref 3.5–5.1)
Sodium: 138 mmol/L (ref 135–145)
Total Bilirubin: 3.1 mg/dL — ABNORMAL HIGH (ref 0.3–1.2)
Total Protein: 5.8 g/dL — ABNORMAL LOW (ref 6.5–8.1)

## 2021-03-01 LAB — IRON AND TIBC
Iron: 47 ug/dL (ref 45–182)
Saturation Ratios: 17 % — ABNORMAL LOW (ref 17.9–39.5)
TIBC: 276 ug/dL (ref 250–450)
UIBC: 229 ug/dL

## 2021-03-01 LAB — RESP PANEL BY RT-PCR (FLU A&B, COVID) ARPGX2
Influenza A by PCR: NEGATIVE
Influenza B by PCR: NEGATIVE
SARS Coronavirus 2 by RT PCR: NEGATIVE

## 2021-03-01 LAB — RETIC PANEL
Immature Retic Fract: 21.3 % — ABNORMAL HIGH (ref 2.3–15.9)
RBC.: 2.72 MIL/uL — ABNORMAL LOW (ref 4.22–5.81)
Retic Count, Absolute: 100.9 10*3/uL (ref 19.0–186.0)
Retic Ct Pct: 3.7 % — ABNORMAL HIGH (ref 0.4–3.1)
Reticulocyte Hemoglobin: 36.9 pg (ref >27.9)

## 2021-03-01 LAB — FOLATE: Folate: 8.8 ng/mL (ref >5.9)

## 2021-03-01 LAB — VITAMIN B12: Vitamin B-12: 1824 pg/mL — ABNORMAL HIGH (ref 180–914)

## 2021-03-01 LAB — HIV ANTIBODY (ROUTINE TESTING W REFLEX): HIV Screen 4th Generation wRfx: NONREACTIVE

## 2021-03-01 LAB — FERRITIN: Ferritin: 183 ng/mL (ref 24–336)

## 2021-03-01 MED ORDER — MORPHINE SULFATE (PF) 2 MG/ML IV SOLN
1.0000 mg | Freq: Once | INTRAVENOUS | Status: AC
  Administered 2021-03-01: 20:00:00 1 mg via INTRAVENOUS
  Filled 2021-03-01: qty 1

## 2021-03-01 MED ORDER — OXYCODONE-ACETAMINOPHEN 5-325 MG PO TABS
1.0000 | ORAL_TABLET | Freq: Once | ORAL | Status: AC
  Administered 2021-03-01: 01:00:00 1 via ORAL
  Filled 2021-03-01: qty 1

## 2021-03-01 MED ORDER — HYDROCHLOROTHIAZIDE 12.5 MG PO CAPS: 12.5000 mg | ORAL_CAPSULE | Freq: Every day | ORAL | Status: DC

## 2021-03-01 MED ORDER — SALINE SPRAY 0.65 % NA SOLN
1.0000 | NASAL | Status: DC | PRN
  Filled 2021-03-01 (×2): qty 44

## 2021-03-01 MED ORDER — SODIUM CHLORIDE 0.9 % IV SOLN
1.0000 g | INTRAVENOUS | Status: DC
  Administered 2021-03-02 – 2021-03-03 (×2): 1 g via INTRAVENOUS
  Filled 2021-03-01 (×3): qty 10

## 2021-03-01 MED ORDER — SODIUM CHLORIDE 0.9% FLUSH
3.0000 mL | Freq: Two times a day (BID) | INTRAVENOUS | Status: DC
  Administered 2021-03-01 – 2021-03-08 (×16): 3 mL via INTRAVENOUS

## 2021-03-01 MED ORDER — IOHEXOL 9 MG/ML PO SOLN
500.0000 mL | ORAL | Status: AC
  Administered 2021-03-01 (×2): 500 mL via ORAL

## 2021-03-01 MED ORDER — GERHARDT'S BUTT CREAM
TOPICAL_CREAM | Freq: Three times a day (TID) | CUTANEOUS | Status: DC
  Filled 2021-03-01 (×2): qty 1

## 2021-03-01 MED ORDER — POTASSIUM CHLORIDE CRYS ER 20 MEQ PO TBCR
40.0000 meq | EXTENDED_RELEASE_TABLET | Freq: Once | ORAL | Status: AC
  Administered 2021-03-02: 40 meq via ORAL
  Filled 2021-03-01: qty 2

## 2021-03-01 MED ORDER — OXYCODONE-ACETAMINOPHEN 5-325 MG PO TABS
1.0000 | ORAL_TABLET | Freq: Three times a day (TID) | ORAL | Status: DC | PRN
  Administered 2021-03-02: 1 via ORAL
  Filled 2021-03-01: qty 1

## 2021-03-01 MED ORDER — LISINOPRIL 10 MG PO TABS: 10.0000 mg | ORAL_TABLET | Freq: Every day | ORAL | Status: DC

## 2021-03-01 NOTE — Consult Note (Signed)
WOC Nurse Consult Note: Reason for Consult:FUYll thickness wounds to left anterior LE and left lateral LE, Stage 3 pressure injury to lower left buttock Wound type: Venous insufficiency and pressure plus moisture  Pressure Injury POA: Yes Measurement: Anterior left LE:  8cm x 5cm x 0.3cm full thickness wound with 70% red, 30% yellow wound bed with part of wound obscured by the presence of dried serum (scabbing, not eschar). Moderate to large amount of serous drainage with musty odor Lateral right LE:  Linear wounds in an 8cm x 3cm area the largest of which measures 3cm x 0.5cm x 0.2cm. Appearance is consistent with scratching/trauma. Small amount serous exudate  Lower left buttock Stage 3 pressure injury:  3cm x 0.4cm x 0.2cm with 90% red, 20% yellow wound bed. Scant serous exudate Wound bed:As described above Drainage (amount, consistency, odor) As described above Periwound: bilateral LEs with ruddy appearance consistent with hemosiderin staining and venous insufficiency.  Buttock skin is moist, but intact. Dressing procedure/placement/frequency: I will provide the patient with a mattress replacement with low air loss feature to assist with microclimate management and pressure redistribution for the buttock PrI, topical care to the bilateral LEs will be with a silver hydrofiber to manage moisture and exudate, wrapping from just below toes to just below knee with assist with compression and blood return (as will elevation while here in hospital).  Sequential compression device use should continue.  [place] nursing team will not follow, but will remain available to this patient, the nursing and medical teams.  Please re-consult if needed. Thanks, Maudie Flakes, MSN, RN, [place], Arther Abbott  Pager# [phone]

## 2021-03-01 NOTE — H&P (Signed)
History and Physical   Carl Rivas:[id number] DOB: 07-Jul-1956 DOA: 02/28/2021  PCP: Pcp, No   Patient coming from: Home  Chief Complaint: Generalized weakness, edema  HPI: Carl Rivas is a 65 y.o. male with medical history significant of cirrhosis, cured hepatitis C, opiate use, thrombocytopenia, obesity, anemia, hypertension, prolonged QT, lymphedema presenting with ongoing generalized weakness and lower extremity edema.  Patient presents complaining of generalized weakness and lower extremity edema has been going on for about a month.  He seen by [facility] recently for chronic abdominal pain and evaluation which included imaging showing a chronic incarcerated hernia which was evaluated by general surgery which indicated no need for urgent intervention.  No significant change in the symptoms for him.  He reports some dark or bloody stools but this stopped about a week and a half ago.  His hemoglobin is stable from 8 days ago.  It is down from 1 year ago however no labs that I can see done in the interim between 8 days and 1 year ago.  He also reports having some intermittent shortness of breath for the last couple weeks and having some new leg cramping and restless leg type symptoms.  He was also noted to have evidence of UTI in the ED. He denies fevers, chills, chest pain, constipation, diarrhea, nausea, vomiting.  ED Course: Vital signs in the ED are stable.  Labs showed BMP with potassium of 2.8.  CBC with hemoglobin stable at 9.2, platelets stable at 70, WBC stable at 3.5.  Urinalysis significant for nitrites, leukocytes, bacteria, hemoglobin.  Urine cultures pending.  Respiratory panel for flu and COVID pending.  Patient received a dose of ceftriaxone and 40 mEq p.o. potassium in the ED.  Review of Systems: As per HPI otherwise all other systems reviewed and are negative.  Past Medical History:  Diagnosis Date  . Cirrhosis ([place])   . Hepatitis C virus infection cured after  antiviral drug therapy    Social History  reports that he has quit smoking. He has never used smokeless tobacco. He reports previous alcohol use. He reports previous drug use.  Allergies  Allergen Reactions  . Penicillins Hives    Family History  Problem Relation Age of Onset  . Colon polyps Mother   . Cancer Neg Hx   Reviewed on admission  Prior to Admission medications   Not on File  Ibuprofen  Physical Exam: Vitals:   02/28/21 2026 02/28/21 2155 02/28/21 2300 03/01/21 0000  BP: 131/73 126/63 133/75 137/75  Pulse: 79 77 90 80  Resp: 20 20    Temp:      TempSrc:      SpO2: 97% 99% 98% 99%  Weight:      Height:       Physical Exam Constitutional:      General: He is not in acute distress.    Appearance: Normal appearance. He is obese.  HENT:     Head: Normocephalic and atraumatic.     Mouth/Throat:     Mouth: Mucous membranes are moist.     Pharynx: Oropharynx is clear.  Eyes:     Extraocular Movements: Extraocular movements intact.     Pupils: Pupils are equal, round, and reactive to light.  Cardiovascular:     Rate and Rhythm: Normal rate and regular rhythm.     Pulses: Normal pulses.     Heart sounds: Normal heart sounds.  Pulmonary:     Effort: Pulmonary effort is normal. No respiratory distress.  Breath sounds: Normal breath sounds.  Abdominal:     General: Bowel sounds are normal. There is no distension.     Palpations: Abdomen is soft.     Tenderness: There is no abdominal tenderness.  Musculoskeletal:        General: No swelling or deformity.  Skin:    General: Skin is warm and dry.  Neurological:     General: No focal deficit present.     Mental Status: Mental status is at baseline.    Labs on Admission: I have personally reviewed following labs and imaging studies  CBC: Recent Labs  Lab 02/28/21 1634  WBC 3.5*  HGB 9.2*  HCT 27.2*  MCV 100.4*  PLT 70*    Basic Metabolic Panel: Recent Labs  Lab 02/28/21 1634  NA 136  K 2.8*   CL 106  CO2 24  GLUCOSE 99  BUN 13  CREATININE 0.82  CALCIUM 8.1*  MG 2.0    GFR: Estimated Creatinine Clearance: 123.4 mL/min (by C-G formula based on SCr of 0.82 mg/dL).  Liver Function Tests: No results for input(s): AST, ALT, ALKPHOS, BILITOT, PROT, ALBUMIN in the last 168 hours.  Urine analysis:    Component Value Date/Time   COLORURINE AMBER (A) 02/28/2021 1634   APPEARANCEUR HAZY (A) 02/28/2021 1634   LABSPEC 1.021 02/28/2021 1634   PHURINE 5.0 02/28/2021 1634   GLUCOSEU NEGATIVE 02/28/2021 1634   HGBUR MODERATE (A) 02/28/2021 1634   BILIRUBINUR NEGATIVE 02/28/2021 [place] 02/28/2021 1634   PROTEINUR 30 (A) 02/28/2021 1634   NITRITE POSITIVE (A) 02/28/2021 1634   LEUKOCYTESUR TRACE (A) 02/28/2021 1634   Radiological Exams on Admission: No results found.  EKG: Independently reviewed.  Sinus rhythm at 89 bpm.  PAC noted.  Prolonged QT at 523.  Assessment/Plan Principal Problem:   Complicated UTI (urinary tract infection) Active Problems:   Hypokalemia   Anemia   Blood in stool   Cirrhosis of liver (HCC)   Folate deficiency   Hepatitis C virus infection cured after antiviral drug therapy   Lymphedema of both lower extremities   Opiate abuse, episodic (HCC)   Prolonged Q-T interval on ECG   Secondary thrombocytopenia  Complicated UTI > Patient with complicated UTI as he is a male.  Urinalysis consistent with UTI urine culture pending. > Patient is somewhat immunocompromise given chronic lymphopenia and history of cirrhosis.  No elevation in white blood cell count from baseline. > Has received dose of ceftriaxone in the ED. - Continue with ceftriaxone  Hypokalemia > Potassium 3.8 on admission has received 40 mEq p.o. in ED. > Magnesium normal in ED. - We will plan on additional supplementation with 40 mEq p.o. in the morning - Trend electrolytes  Anemia Weakness Bloody Stool > Is anemic to 9.2 which is stable from 8 days ago.  1  year ago his hemoglobin was 12 however no labs in the intervening year other than 8 days ago. > Noted to have a history of a folate deficiency per chart review. > Is reporting some dark stools that stopped about a week and a half ago.  It is possible that he had ongoing GI bleed leading up to his hemoglobin check 8 days ago which has self resolved. > Also with his weakness, and description of restless leg there is suspicion for iron deficiency anemia whether secondary to GI bleed or not. > Does have history of chronic incarcerated hernia recently evaluated at [facility] none urgent for surgery at that  time.  And takes NSAIDs for pain relief at times. - T/S performed in ED - Check FOBT - Check iron studies, folate, B12, reticulocyte count - SCDs for now  Cirrhosis Thrombocytopenia > Has a history of cirrhosis secondary to hepatitis C which has been cured with antivirals. > Platelets stable at around 70. - Continue to monitor.  Prolonged QTC > Patient has a history of prolonged QT EKG here shows QTC of 523 - Avoid QT prolonging medications - Recheck EKG in the morning.  Lymphedema > Has history of this his extremities continue to remain edematous - We will continue to monitor for now  History of opioid use disorder - Not currently on any methadone but he was previously - Avoid any long courses of opioids  Abnormal CT > At [facility] on CT Ab/P: Multiple pulmonary nodules in the right lung bases are indeterminate,  and could be due to neoplasm versus atypical infection. Dedicated CT chest is recommended to further assess. - Will need follow up, consult to SW for PCP needs  DVT prophylaxis: SCDs for now as we work-up dark stools  Code Status:   Full Family Communication:  None on admission Disposition Plan:   Patient is from:  Home  Anticipated DC to:  Home  Anticipated DC date:  1 to 2 days  Anticipated DC barriers: None  Consults called:  None  Admission status:  Observation, telemetry    Severity of Illness: The appropriate patient status for this patient is OBSERVATION. Observation status is judged to be reasonable and necessary in order to provide the required intensity of service to ensure the patient's safety. The patient's presenting symptoms, physical exam findings, and initial radiographic and laboratory data in the context of their medical condition is felt to place them at decreased risk for further clinical deterioration. Furthermore, it is anticipated that the patient will be medically stable for discharge from the hospital within 2 midnights of admission. The following factors support the patient status of observation.   " The patient's presenting symptoms include weakness, edema. " The physical exam findings include obese, abdominal hernia,. " The initial radiographic and laboratory data are potassium 2.8, urinalysis consistent with UTI, WBC 3.5, hemoglobin 9.2, platelets 70.   Marcelyn Bruins MD [facility]  How to contact the [facility] Attending or Consulting provider [place] or covering provider during after hours [place], for this patient?   1. Check the care team in [facility] and look for a) attending/consulting TR[facility] provider listed and b) the [facility] team listed 2. Log into [email or web address] and use [place]'s universal password to access. If you do not have the password, please contact the hospital operator. 3. Locate the [facility] provider you are looking for under [facility] and page to a number that you can be directly reached. 4. If you still have difficulty reaching the provider, please page the [facility] (Director on Call) for the Hospitalists listed on amion for assistance.  03/01/2021, 1:04 AM

## 2021-03-01 NOTE — ED Notes (Signed)
Report to Amber, RN

## 2021-03-01 NOTE — Progress Notes (Signed)
PROGRESS NOTE  Carl Rivas  DOB: June 22, 1956  PCP: Kathyrn Lass YSA:[id number]  DOA: 02/28/2021  LOS: 0 days  Hospital Day: 2   Chief Complaint  Patient presents with  . Weakness   Brief narrative: Carl Rivas is a 65 y.o. male with PMH significant for morbid obesity, history of alcoholism, opioid use, cured hepatitis C, liver cirrhosis, thrombocytopenia, chronic anemia, hypertension, prolonged QT, lymphedema.  Patient lives at home alone, his brother lives close by. Patient presented to the ED on 5/19 with ongoing generalized weakness and lower extremity edema, ongoing for a month.  Also reports intermittent shortness of breath as well as leg cramping.  In the ED, hemodynamically stable, breathing on room air Potassium low at 2.8, hemoglobin low at 9.2, platelet low but stable at 70 Urinalysis significant for nitrites, leukocytes, bacteria, hemoglobin.   Patient was started on IV Rocephin, given potassium replacement, admitted to hospitalist service  Subjective: Patient was seen and examined this afternoon.  Lying on bed.  Not in distress.  Looks chronically sick, unkept. Chart reviewed Remains hemodynamically stable.  Potassium level improved this morning to 3.3, albumin level low at 2.1, hemoglobin down to 8.2 this morning, platelet low at 61  Assessment/Plan: Complicated UTI -Urinalysis significant for nitrites, leukocytes, bacteria -Pending urine culture.  Currently on IV Rocephin.  Hypokalemia -Potassium low at 2.8 on admission, probably causing leg cramping symptoms. -Potassium level is improving with replacement.  It is 3.3 this morning.  Continue oral replacement. -Not having any nausea vomiting, diarrhea.  Encourage oral intake Recent Labs  Lab 02/28/21 1634 03/01/21 0638  K 2.8* 3.3*  MG 2.0  --    Intermittent bloody stool Chronic anemia History of folic acid deficiency -Reports intermittent dark stool in the setting of liver cirrhosis.  Check  FOBT -Baseline hemoglobin was 12 a year ago, presented with a hemoglobin of 9.2, 1 unit drop in hemoglobin in last 24 hours to 8.2 this morning without obvious evidence of bleeding.  Iron panel with intact iron, vitamin N23 and folic acid level. Recent Labs    02/28/21 1633 02/28/21 1634 03/01/21 0638  HGB  --  9.2* 8.2*  MCV  --  100.4* 100.8*  VITAMINB12 1,824*  --   --   FOLATE 8.8  --   --   FERRITIN 183  --   --   TIBC 276  --   --   IRON 47  --   --   RETICCTPCT 3.7*  --   --    History of alcoholism, opiate use History of cured hepatitis C  Liver cirrhosis -Not on any meds at home -Right upper quadrant abdominal ultrasound today showed marked hepatic cirrhosis with findings mostly suggestive of portal venous occlusion without flow visible.  Also noted to have focal hepatic lesion suspicious for hepatocellular carcinoma.  Per recommendation by radiologist, I ordered for CT liver protocol. -Currently does not look hypervolemic.  Will hold off on giving Lasix at this time.  Thrombocytopenia -Running low.  Probably related to liver cirrhosis no active bleeding.  Continue to monitor Recent Labs  Lab 02/28/21 1634 03/01/21 0638  PLT 70* 61*   Prolonged QTC -QTC of 523.  Monitor electrolytes. - Avoid QT prolonging medications - Recheck EKG in the morning.  Hypoalbuminemia -Albumin level low at 2.1, probably related to liver cirrhosis.    Multiple pulmonary nodules -CT abdomen pelvis from 02/20/2021 done [facility] showed multiple lobulated solid pulmonary nodules present in the right lower lobe  worrisome for malignancy.  Dedicated CT chest was recommended but I do not see it in care everywhere.  Will obtain CT chest today  Chronic incarcerated abdominal hernia -He was recently at [facility] for chronic abdominal pain and evaluation which included imaging showing a chronic incarcerated hernia.  He was evaluated by general surgery which indicated no need for urgent intervention.  No  significant change in the symptoms for him.  Chronic wounds- POA -Patient has chronic cellulitis, poor hygiene of bilateral lower extremities.  There is an open wound on the left lower leg and back of his thigh.  Discussed with wound care nurse.  No need of surgical debridement.  Local wound care recommended.  Mobility: Needs PT eval Code Status:   Code Status: Full Code  Nutritional status: Body mass index is 43.08 kg/m.     Diet Order            Diet NPO time specified  Diet effective now               Cardiac diet after CT scan. DVT prophylaxis: SCDs Start: 03/01/21 0002   Antimicrobials:  Rocephin Fluid: None Consultants: None Family Communication:  Not at bedside  Status is: Inpatient  Remains inpatient appropriate because: IV antibiotics, needs further work-up  Dispo: The patient is from: Home              Anticipated d/c is to: Unclear at this time              Patient currently is not medically stable to d/c.   Difficult to place patient No     Infusions:  . [START ON 03/02/2021] cefTRIAXone (ROCEPHIN)  IV      Scheduled Meds: . Gerhardt's butt cream   Topical TID  . iohexol  500 mL Oral Q1 Hr x 2  . [START ON 03/02/2021] potassium chloride  40 mEq Oral Once  . sodium chloride flush  3 mL Intravenous Q12H    Antimicrobials: Anti-infectives (From admission, onward)   Start     Dose/Rate Route Frequency Ordered Stop   03/02/21 2200  cefTRIAXone (ROCEPHIN) 1 g in sodium chloride 0.9 % 100 mL IVPB        1 g 200 mL/hr over 30 Minutes Intravenous Every 24 hours 03/01/21 0046     02/28/21 2330  cefTRIAXone (ROCEPHIN) 1 g in sodium chloride 0.9 % 100 mL IVPB        1 g 200 mL/hr over 30 Minutes Intravenous  Once 02/28/21 2319 03/01/21 0100      PRN meds:    Objective: Vitals:   03/01/21 1117 03/01/21 1519  BP: 106/90 125/63  Pulse: 80 79  Resp: 18 16  Temp: 98.8 F (37.1 C) 98.5 F (36.9 C)  SpO2: 98% 100%    Intake/Output Summary (Last 24  hours) at 03/01/2021 1529 Last data filed at 03/01/2021 1014 Gross per 24 hour  Intake 1000 ml  Output 575 ml  Net 425 ml   Filed Weights   02/28/21 1630 03/01/21 0500  Weight: 127 kg (!) 136.2 kg   Weight change:  Body mass index is 43.08 kg/m.   Physical Exam: General exam: Middle-aged Caucasian male.  Looks older for his age. Skin: No rashes, lesions or ulcers. HEENT: Atraumatic, normocephalic, no obvious bleeding Lungs: Clear to auscultation bilaterally CVS: Regular rate and rhythm, no murmur GI/Abd soft, distended, abdominal hernia present, mild tenderness, bowel sound present CNS: Alert, awake, oriented x3 Psychiatry: Depressed look Extremities:  Chronic bilateral lower extremity stasis changes, poor hygiene, open wound on the left lower leg  Data Review: I have personally reviewed the laboratory data and studies available.  Recent Labs  Lab 02/28/21 1634 03/01/21 0638  WBC 3.5* 3.0*  HGB 9.2* 8.2*  HCT 27.2* 24.2*  MCV 100.4* 100.8*  PLT 70* 61*   Recent Labs  Lab 02/28/21 1634 03/01/21 0638  NA 136 138  K 2.8* 3.3*  CL 106 110  CO2 24 24  GLUCOSE 99 97  BUN 13 12  CREATININE 0.82 0.80  CALCIUM 8.1* 7.8*  MG 2.0  --     F/u labs ordered Unresulted Labs (From admission, onward)          Start     Ordered   03/02/21 0500  CBC with Differential/Platelet  Daily,   R     Question:  Specimen collection method  Answer:  Lab=Lab collect   03/01/21 1529   03/02/21 0500  Comprehensive metabolic panel  Daily,   R     Question:  Specimen collection method  Answer:  Lab=Lab collect   03/01/21 1529   03/02/21 0500  Magnesium  Tomorrow morning,   STAT       Question:  Specimen collection method  Answer:  Lab=Lab collect   03/01/21 1529   03/02/21 0500  Phosphorus  Tomorrow morning,   R       Question:  Specimen collection method  Answer:  Lab=Lab collect   03/01/21 1529   03/02/21 0500  Ammonia  Tomorrow morning,   R       Question:  Specimen collection  method  Answer:  Lab=Lab collect   03/01/21 1529   03/01/21 0053  Occult blood card to lab, stool RN will collect  Once,   STAT       Question:  Specimen to be collected by:  Answer:  RN will collect   03/01/21 0052   02/28/21 2319  Urine culture  Add-on,   AD        02/28/21 2319          Signed, Terrilee Croak, MD [facility] 03/01/2021

## 2021-03-01 NOTE — TOC Initial Note (Signed)
Transition of Care [facility]) - Initial/Assessment Note    Patient Details  Name: Carl Rivas MRN: [id number] Date of Birth: [birth year]  [facility] CM/SW Contact:    Shelbie Hutching, RN Phone Number: 03/01/2021, 3:50 PM  Clinical Narrative:                 Patient placed under observation for complicated UTI.  Patient has a history of liver cirrhosis.  RNCM met with patient at the bedside and introduced self and explained role in DC planning.  Patient is from home and lives alone.  His brother lives close by and will be able to pick him up from the hospital at discharge.  Patient does not currently have a PCP or pharmacy.  RNCM provided patient with information on community clinics and [facility].  Patient says that [place] [facility] is closest to him.  The close at 2pm on Fridays.  RNCM informed patient to call and schedule an appointment on Monday if discharged from the hospital over the weekend.  If patient still here on Monday RNCM will call and get patient scheduled.  Patient provided [facility] application.   No other needs identified at this time.   Expected Discharge Plan: Home/Self Care Barriers to Discharge: Continued Medical Work up   Patient Goals and CMS Choice        Expected Discharge Plan and Services Expected Discharge Plan: Home/Self Care   Discharge Planning Services: CM Consult   Living arrangements for the past 2 months: Mobile Home                 DME Arranged: N/A DME Agency: NA       HH Arranged: NA HH Agency: NA        Prior Living Arrangements/Services Living arrangements for the past 2 months: Mobile Home Lives with:: Self Patient language and need for interpreter reviewed:: Yes Do you feel safe going back to the place where you live?: Yes      Need for Family Participation in Patient Care: Yes (Comment) (complicated UTI) Care giver support system in place?: Yes (comment) (brother and sister)   Criminal  Activity/Legal Involvement Pertinent to Current Situation/Hospitalization: No - Comment as needed  Activities of Daily Living Home Assistive Devices/Equipment: Cane (specify quad or straight) ADL Screening (condition at time of admission) Patient's cognitive ability adequate to safely complete daily activities?: Yes Is the patient deaf or have difficulty hearing?: No Does the patient have difficulty seeing, even when wearing glasses/contacts?: No Does the patient have difficulty concentrating, remembering, or making decisions?: No Patient able to express need for assistance with ADLs?: Yes Does the patient have difficulty dressing or bathing?: Yes Independently performs ADLs?: Yes (appropriate for developmental age) Does the patient have difficulty walking or climbing stairs?: Yes Weakness of Legs: Both Weakness of Arms/Hands: None  Permission Sought/Granted   Permission granted to share information with : No              Emotional Assessment Appearance:: Appears stated age Attitude/Demeanor/Rapport: Engaged Affect (typically observed): Accepting Orientation: : Oriented to Self,Oriented to Place,Oriented to  Time,Oriented to Situation Alcohol / Substance Use: Not Applicable Psych Involvement: No (comment)  Admission diagnosis:  Lower urinary tract infectious disease [R15.4] Complicated UTI (urinary tract infection) [N39.0] Patient Active Problem List   Diagnosis Date Noted  . Complicated UTI (urinary tract infection) 03/01/2021  . Hypokalemia 03/01/2021  . Anemia 03/01/2021  . Blood in stool 03/01/2021  .  Lymphedema of both lower extremities 03/01/2021  . Pressure injury of skin 03/01/2021  . Folate deficiency 04/03/2020  . Hepatitis C virus infection cured after antiviral drug therapy 11/22/2018  . Essential hypertension 08/17/2018  . Prolonged Q-T interval on ECG 08/17/2018  . Secondary thrombocytopenia 08/17/2018  . Opiate abuse, episodic ([place]) 08/13/2012  .  Cirrhosis of liver ([place]) 12/12/2011   PCP:  Pcp, No Pharmacy:  No Pharmacies Listed    Social Determinants of Health (SDOH) Interventions    Readmission Risk Interventions No flowsheet data found.

## 2021-03-02 ENCOUNTER — Inpatient Hospital Stay: Payer: Self-pay

## 2021-03-02 DIAGNOSIS — R609 Edema, unspecified: Secondary | ICD-10-CM

## 2021-03-02 DIAGNOSIS — R918 Other nonspecific abnormal finding of lung field: Secondary | ICD-10-CM

## 2021-03-02 DIAGNOSIS — N390 Urinary tract infection, site not specified: Secondary | ICD-10-CM

## 2021-03-02 DIAGNOSIS — R160 Hepatomegaly, not elsewhere classified: Secondary | ICD-10-CM

## 2021-03-02 DIAGNOSIS — K7031 Alcoholic cirrhosis of liver with ascites: Secondary | ICD-10-CM

## 2021-03-02 DIAGNOSIS — I890 Lymphedema, not elsewhere classified: Secondary | ICD-10-CM

## 2021-03-02 DIAGNOSIS — D6959 Other secondary thrombocytopenia: Secondary | ICD-10-CM

## 2021-03-02 LAB — COMPREHENSIVE METABOLIC PANEL
ALT: 19 U/L (ref 0–44)
AST: 101 U/L — ABNORMAL HIGH (ref 15–41)
Albumin: 2 g/dL — ABNORMAL LOW (ref 3.5–5.0)
Alkaline Phosphatase: 101 U/L (ref 38–126)
Anion gap: 4 — ABNORMAL LOW (ref 5–15)
BUN: 11 mg/dL (ref 8–23)
CO2: 25 mmol/L (ref 22–32)
Calcium: 7.6 mg/dL — ABNORMAL LOW (ref 8.9–10.3)
Chloride: 108 mmol/L (ref 98–111)
Creatinine, Ser: 0.7 mg/dL (ref 0.61–1.24)
GFR, Estimated: >60 mL/min (ref >60)
Glucose, Bld: 94 mg/dL (ref 70–99)
Potassium: 3.5 mmol/L (ref 3.5–5.1)
Sodium: 137 mmol/L (ref 135–145)
Total Bilirubin: 3.8 mg/dL — ABNORMAL HIGH (ref 0.3–1.2)
Total Protein: 6.1 g/dL — ABNORMAL LOW (ref 6.5–8.1)

## 2021-03-02 LAB — PROTIME-INR
INR: 1.5 — ABNORMAL HIGH (ref 0.8–1.2)
Prothrombin Time: 18.3 seconds — ABNORMAL HIGH (ref 11.4–15.2)

## 2021-03-02 LAB — CBC WITH DIFFERENTIAL/PLATELET
Abs Immature Granulocytes: 0.01 10*3/uL (ref 0.00–0.07)
Basophils Absolute: 0.1 10*3/uL (ref 0.0–0.1)
Basophils Relative: 1 %
Eosinophils Absolute: 0.1 10*3/uL (ref 0.0–0.5)
Eosinophils Relative: 2 %
HCT: 26.2 % — ABNORMAL LOW (ref 39.0–52.0)
Hemoglobin: 8.8 g/dL — ABNORMAL LOW (ref 13.0–17.0)
Immature Granulocytes: 0 %
Lymphocytes Relative: 22 %
Lymphs Abs: 0.8 10*3/uL (ref 0.7–4.0)
MCH: 34.4 pg — ABNORMAL HIGH (ref 26.0–34.0)
MCHC: 33.6 g/dL (ref 30.0–36.0)
MCV: 102.3 fL — ABNORMAL HIGH (ref 80.0–100.0)
Monocytes Absolute: 0.4 10*3/uL (ref 0.1–1.0)
Monocytes Relative: 11 %
Neutro Abs: 2.3 10*3/uL (ref 1.7–7.7)
Neutrophils Relative %: 64 %
Platelets: 65 10*3/uL — ABNORMAL LOW (ref 150–400)
RBC: 2.56 MIL/uL — ABNORMAL LOW (ref 4.22–5.81)
RDW: 15.9 % — ABNORMAL HIGH (ref 11.5–15.5)
WBC: 3.6 10*3/uL — ABNORMAL LOW (ref 4.0–10.5)
nRBC: 0 % (ref 0.0–0.2)

## 2021-03-02 LAB — APTT: aPTT: 38 seconds — ABNORMAL HIGH (ref 24–36)

## 2021-03-02 LAB — MAGNESIUM: Magnesium: 1.8 mg/dL (ref 1.7–2.4)

## 2021-03-02 LAB — AMMONIA: Ammonia: 21 umol/L (ref 9–35)

## 2021-03-02 LAB — PHOSPHORUS: Phosphorus: 2.7 mg/dL (ref 2.5–4.6)

## 2021-03-02 MED ORDER — DIPHENHYDRAMINE HCL 12.5 MG/5ML PO ELIX
12.5000 mg | ORAL_SOLUTION | Freq: Once | ORAL | Status: AC
  Administered 2021-03-02: 05:00:00 12.5 mg via ORAL
  Filled 2021-03-02: qty 5

## 2021-03-02 MED ORDER — PNEUMOCOCCAL VAC POLYVALENT 25 MCG/0.5ML IJ INJ
0.5000 mL | INJECTION | INTRAMUSCULAR | Status: AC
  Administered 2021-03-04: 11:00:00 0.5 mL via INTRAMUSCULAR
  Filled 2021-03-02: qty 0.5

## 2021-03-02 MED ORDER — OXYCODONE-ACETAMINOPHEN 5-325 MG PO TABS
1.0000 | ORAL_TABLET | Freq: Four times a day (QID) | ORAL | Status: DC | PRN
  Administered 2021-03-02 – 2021-03-05 (×11): 1 via ORAL
  Filled 2021-03-02 (×11): qty 1

## 2021-03-02 MED ORDER — ALBUMIN HUMAN 25 % IV SOLN
25.0000 g | INTRAVENOUS | Status: DC
  Filled 2021-03-02: qty 100

## 2021-03-02 MED ORDER — FUROSEMIDE 10 MG/ML IJ SOLN
40.0000 mg | Freq: Every day | INTRAMUSCULAR | Status: DC
  Administered 2021-03-02 – 2021-03-08 (×7): 40 mg via INTRAVENOUS
  Filled 2021-03-02 (×7): qty 4

## 2021-03-02 MED ORDER — GADOBUTROL 1 MMOL/ML IV SOLN
10.0000 mL | Freq: Once | INTRAVENOUS | Status: AC | PRN
  Administered 2021-03-02: 10 mL via INTRAVENOUS

## 2021-03-02 NOTE — Consult Note (Addendum)
Hematology/Oncology Consult note [facility] Telephone:([phone] [phone]  Patient Care Team: Pcp, No as PCP - General   Name of the patient: Carl Rivas  [id number]  1956-10-01   Date of visit: 03/02/21 REASON FOR COSULTATION:  Liver mass History of presenting illness-  65 y.o. male with PMH listed at below who presents to ER for evaluation of generalized weakness and lower extremity edema.  Patient reports that symptom has been ongoing for about a month. 03/01/2021 CT chest abdomen pelvis showed multiple large nodules in the right lower lobe, concerning for metastatic disease or primary malignancy.  Malignant ill-defined low-density is in the hepatic parenchyma suggesting malignancy or metastatic disease.  Liver cirrhosis, portal hypertension moderate ascites.  Enlarged periumbilical hernia.  Oncology was consulted for further evaluation management.  Sister Butch Penny at the bedside.  Patient lives at home by himself.  He is single.  Closest family members are his brother and his sister Butch Penny..  Usually he walks with a walker, per sister, patient has not been able to walk for the past few months. Patient has intermittent chronic shortness of breath with exertion. History of alcoholism, hepatitis C status posttreatment, liver cirrhosis, thrombocytopenia.  Review of Systems  Constitutional: Positive for fatigue and unexpected weight change. Negative for appetite change, chills and fever.  HENT:   Negative for hearing loss and voice change.   Eyes: Negative for eye problems and icterus.  Respiratory: Positive for shortness of breath. Negative for chest tightness and cough.   Cardiovascular: Negative for chest pain and leg swelling.  Gastrointestinal: Positive for abdominal distention. Negative for abdominal pain, diarrhea and nausea.  Endocrine: Negative for hot flashes.  Genitourinary: Negative for difficulty urinating, dysuria and frequency.    Musculoskeletal: Negative for arthralgias.  Skin: Negative for itching and rash.  Neurological: Negative for light-headedness and numbness.  Hematological: Negative for adenopathy. Does not bruise/bleed easily.  Psychiatric/Behavioral: Negative for confusion.    Allergies  Allergen Reactions  . Penicillins Hives    Patient Active Problem List   Diagnosis Date Noted  . Complicated UTI (urinary tract infection) 03/01/2021  . Hypokalemia 03/01/2021  . Anemia 03/01/2021  . Blood in stool 03/01/2021  . Lymphedema of both lower extremities 03/01/2021  . Pressure injury of skin 03/01/2021  . Folate deficiency 04/03/2020  . Hepatitis C virus infection cured after antiviral drug therapy 11/22/2018  . Essential hypertension 08/17/2018  . Prolonged Q-T interval on ECG 08/17/2018  . Secondary thrombocytopenia 08/17/2018  . Opiate abuse, episodic ([place]) 08/13/2012  . Cirrhosis of liver ([place]) 12/12/2011     Past Medical History:  Diagnosis Date  . Cirrhosis ([place])   . Hepatitis C virus infection cured after antiviral drug therapy       Social History   Socioeconomic History  . Marital status: Single    Spouse name: Not on file  . Number of children: Not on file  . Years of education: Not on file  . Highest education level: Not on file  Occupational History  . Not on file  Tobacco Use  . Smoking status: Former Research scientist (life sciences)  . Smokeless tobacco: Never Used  Substance and Sexual Activity  . Alcohol use: Not Currently  . Drug use: Not Currently  . Sexual activity: Not on file  Other Topics Concern  . Not on file  Social History Narrative  . Not on file   Social Determinants of Health   Financial Resource Strain: Not on file  Food Insecurity: Not on  file  Transportation Needs: Not on file  Physical Activity: Not on file  Stress: Not on file  Social Connections: Not on file  Intimate Partner Violence: Not on file     Family History  Problem Relation Age of Onset  .  Colon polyps Mother   . Cancer Neg Hx      Current Facility-Administered Medications:  .  [START ON 03/04/2021] albumin human 25 % solution 25 g, 25 g, Intravenous, UD, Dahal, Binaya, MD .  cefTRIAXone (ROCEPHIN) 1 g in sodium chloride 0.9 % 100 mL IVPB, 1 g, Intravenous, Q24H, Trilby Drummer, Alexander B, MD .  furosemide (LASIX) injection 40 mg, 40 mg, Intravenous, Daily, Dahal, Binaya, MD, 40 mg at 03/02/21 1238 .  Gerhardt's butt cream, , Topical, TID, Terrilee Croak, MD, Given at 03/02/21 0926 .  oxyCODONE-acetaminophen (PERCOCET/ROXICET) 5-325 MG per tablet 1 tablet, 1 tablet, Oral, Q8H PRN, Terrilee Croak, MD, 1 tablet at 03/02/21 0923 .  [START ON 03/03/2021] pneumococcal 23 valent vaccine (PNEUMOVAX-23) injection 0.5 mL, 0.5 mL, Intramuscular, Tomorrow-1000, Dahal, Binaya, MD .  sodium chloride (OCEAN) 0.65 % nasal spray 1 spray, 1 spray, Each Nare, PRN, Dahal, Binaya, MD .  sodium chloride flush (NS) 0.9 % injection 3 mL, 3 mL, Intravenous, Q12H, Marcelyn Bruins, MD, 3 mL at 03/02/21 0925   Physical exam:  Vitals:   03/02/21 0500 03/02/21 0515 03/02/21 0806 03/02/21 1127  BP:  137/67 (!) 133/56 (!) 141/66  Pulse:  80 73 75  Resp:  18 15 18   Temp:    98.6 F (37 C)  TempSrc:  Oral  Oral  SpO2:  98% 99% 98%  Weight: (!) 305 lb 12.5 oz (138.7 kg)     Height:       Physical Exam Constitutional:      General: He is not in acute distress.    Appearance: He is obese. He is not diaphoretic.  HENT:     Head: Normocephalic and atraumatic.     Nose: Nose normal.     Mouth/Throat:     Pharynx: No oropharyngeal exudate.  Eyes:     General: No scleral icterus.    Pupils: Pupils are equal, round, and reactive to light.  Cardiovascular:     Rate and Rhythm: Normal rate.     Pulses: Normal pulses.  Pulmonary:     Effort: Pulmonary effort is normal.  Abdominal:     General: There is distension.     Palpations: Abdomen is soft.     Tenderness: There is no abdominal tenderness.   Musculoskeletal:        General: Swelling present. Normal range of motion.     Cervical back: Normal range of motion and neck supple.  Skin:    General: Skin is warm and dry.     Findings: No erythema.  Neurological:     General: No focal deficit present.     Mental Status: He is alert and oriented to person, place, and time.     Motor: No abnormal muscle tone.  Psychiatric:        Mood and Affect: Mood and affect normal.         CMP Latest Ref Rng & Units 03/02/2021  Glucose 70 - 99 mg/dL 94  BUN 8 - 23 mg/dL 11  Creatinine 0.61 - 1.24 mg/dL 0.70  Sodium 135 - 145 mmol/L 137  Potassium 3.5 - 5.1 mmol/L 3.5  Chloride 98 - 111 mmol/L 108  CO2 22 - 32 mmol/L  25  Calcium 8.9 - 10.3 mg/dL 7.6(L)  Total Protein 6.5 - 8.1 g/dL 6.1(L)  Total Bilirubin 0.3 - 1.2 mg/dL 3.8(H)  Alkaline Phos 38 - 126 U/L 101  AST 15 - 41 U/L 101(H)  ALT 0 - 44 U/L 19   CBC Latest Ref Rng & Units 03/02/2021  WBC 4.0 - 10.5 K/uL 3.6(L)  Hemoglobin 13.0 - 17.0 g/dL 8.8(L)  Hematocrit 39.0 - 52.0 % 26.2(L)  Platelets 150 - 400 K/uL 65(L)    RADIOGRAPHIC STUDIES: I have personally reviewed the radiological images as listed and agreed with the findings in the report. CT ABDOMEN WO CONTRAST  Result Date: 03/01/2021 CLINICAL DATA:  Hepatic cirrhosis.  Anemia. EXAM: CT CHEST, ABDOMEN AND PELVIS WITHOUT CONTRAST TECHNIQUE: Multidetector CT imaging of the chest and abdomen was performed following the standard protocol without IV contrast. COMPARISON:  None. FINDINGS: CT CHEST FINDINGS Cardiovascular: Atherosclerosis of thoracic aorta is noted without aneurysm formation. Coronary calcifications are noted. Normal cardiac size. No pericardial effusion. Mediastinum/Nodes: No enlarged mediastinal, hilar, or axillary lymph nodes. Thyroid gland, trachea, and esophagus demonstrate no significant findings. Lungs/Pleura: No pneumothorax or pleural effusion is noted. Multiple large nodules are noted in the right lower  lobe concerning for malignancy and metastatic disease. Largest nodule measures 2.7 x 2.2 cm. 7 mm nodule is noted in left upper lobe best seen on image number 35 of series 4. Musculoskeletal: No chest wall mass or suspicious bone lesions identified. CT ABDOMEN FINDINGS Hepatobiliary: Nodular hepatic contours are noted consistent with hepatic cirrhosis. Moderate amount of ascites is noted around the liver. Cholelithiasis is noted. No biliary dilatation is noted. Ill-defined low densities are noted throughout the hepatic parenchyma suggesting possible malignancy or metastatic disease. Pancreas: Unremarkable. No pancreatic ductal dilatation or surrounding inflammatory changes. Spleen: Moderate splenomegaly is noted. Adrenals/Urinary Tract: Adrenal glands and kidneys are unremarkable. Stomach/Bowel: The stomach and visualized bowel loops are unremarkable. Vascular/Lymphatic: Atherosclerosis of abdominal aorta is noted without aneurysm formation. Collateral veins are noted suggesting portal hypertension. No definite significant adenopathy is noted. Other: Moderate ascites is noted around the liver and spleen. Large periumbilical hernia is noted. Musculoskeletal: No acute or significant osseous findings. IMPRESSION: Multiple large nodules are noted in the right lower lobe consistent with metastatic disease or primary malignancy. Findings consistent with hepatic cirrhosis. There appears to be multiple ill-defined low densities in the hepatic parenchyma suggesting malignancy or metastatic disease. Further evaluation with MRI is recommended. Moderate splenomegaly is noted with collateral veins present suggesting portal hypertension. Moderate ascites is noted around the liver and spleen. Large periumbilical hernia is noted. Coronary artery calcifications are noted suggesting coronary artery disease. Aortic Atherosclerosis (ICD10-I70.0). Electronically Signed   By: Marijo Conception [name]   On: 03/01/2021 17:25   CT CHEST WO  CONTRAST  Result Date: 03/01/2021 CLINICAL DATA:  Hepatic cirrhosis.  Anemia. EXAM: CT CHEST, ABDOMEN AND PELVIS WITHOUT CONTRAST TECHNIQUE: Multidetector CT imaging of the chest and abdomen was performed following the standard protocol without IV contrast. COMPARISON:  None. FINDINGS: CT CHEST FINDINGS Cardiovascular: Atherosclerosis of thoracic aorta is noted without aneurysm formation. Coronary calcifications are noted. Normal cardiac size. No pericardial effusion. Mediastinum/Nodes: No enlarged mediastinal, hilar, or axillary lymph nodes. Thyroid gland, trachea, and esophagus demonstrate no significant findings. Lungs/Pleura: No pneumothorax or pleural effusion is noted. Multiple large nodules are noted in the right lower lobe concerning for malignancy and metastatic disease. Largest nodule measures 2.7 x 2.2 cm. 7 mm nodule is noted in left upper lobe  best seen on image number 35 of series 4. Musculoskeletal: No chest wall mass or suspicious bone lesions identified. CT ABDOMEN FINDINGS Hepatobiliary: Nodular hepatic contours are noted consistent with hepatic cirrhosis. Moderate amount of ascites is noted around the liver. Cholelithiasis is noted. No biliary dilatation is noted. Ill-defined low densities are noted throughout the hepatic parenchyma suggesting possible malignancy or metastatic disease. Pancreas: Unremarkable. No pancreatic ductal dilatation or surrounding inflammatory changes. Spleen: Moderate splenomegaly is noted. Adrenals/Urinary Tract: Adrenal glands and kidneys are unremarkable. Stomach/Bowel: The stomach and visualized bowel loops are unremarkable. Vascular/Lymphatic: Atherosclerosis of abdominal aorta is noted without aneurysm formation. Collateral veins are noted suggesting portal hypertension. No definite significant adenopathy is noted. Other: Moderate ascites is noted around the liver and spleen. Large periumbilical hernia is noted. Musculoskeletal: No acute or significant osseous  findings. IMPRESSION: Multiple large nodules are noted in the right lower lobe consistent with metastatic disease or primary malignancy. Findings consistent with hepatic cirrhosis. There appears to be multiple ill-defined low densities in the hepatic parenchyma suggesting malignancy or metastatic disease. Further evaluation with MRI is recommended. Moderate splenomegaly is noted with collateral veins present suggesting portal hypertension. Moderate ascites is noted around the liver and spleen. Large periumbilical hernia is noted. Coronary artery calcifications are noted suggesting coronary artery disease. Aortic Atherosclerosis (ICD10-I70.0). Electronically Signed   By: Marijo Conception [name]   On: 03/01/2021 17:25   US Abdomen Limited RUQ (LIVER/GB)  Addendum Date: 03/01/2021   ADDENDUM REPORT: 03/01/2021 14:14 ADDENDUM: These results were called by telephone at the time of interpretation on 03/01/2021 at 2:14 pm to provider [facility] , who verbally acknowledged these results. Electronically Signed   By: Zetta Bills [name]   On: 03/01/2021 14:14   Result Date: 03/01/2021 CLINICAL DATA:  Cirrhosis, anasarca. EXAM: ULTRASOUND ABDOMEN LIMITED RIGHT UPPER QUADRANT COMPARISON:  Abdominal sonogram from 2007. FINDINGS: Gallbladder: Marked gallbladder wall thickening in the presence of ascites and marked liver disease. No reported tenderness over the gallbladder. Gallbladder wall thickness in excess of 7 mm. Common bile duct: Diameter: 6 mm Liver: Markedly limited assessment of hepatic parenchyma. Poor sonographic window and diffuse edema in the setting of ascites limits assessment. Subtle area of nodularity along the margin of the liver on image 18 is hypoechoic relative to adjacent liver raising the question of a focal liver lesion measuring 2.8 x 2.6 cm. Liver is overall extremely heterogeneous. No visible flow in the portal vein both on color and power Doppler imaging. Other: Moderate volume ascites. IMPRESSION:  Marked hepatic cirrhosis with findings that are most suggestive of portal venous occlusion without flow visible on color or power Doppler imaging. Potential focal hepatic lesion in the setting of cirrhosis. Findings could be seen in the setting of hepatocellular carcinoma. Exam limited by body habitus and poor sonographic window. For above findings would suggest multiphase CT with liver protocol for further evaluation, to guide further management. Ascites and anasarca a with gallbladder wall thickening likely related to liver disease and portal hypertension. A call is out to the referring provider to further discuss findings in the above case. Electronically Signed: By: Zetta Bills [name] On: 03/01/2021 14:10    Assessment and plan-   #Multiple liver mass in cirrhotic liver Suspect HCC.  Check AFP, CA 19 9 Recommend MRI abdomen with and without contrast for further evaluation. Liver cirrhosis, albumin 2, total bilirubin 3.8.  Check PT and PTT.  #Ascites, paracentesis with fluid studies. #Lung nodules, metastasis versus primary lung neoplasm. Pending  above work-up. #Thrombocytopenia, chronic, due to splenomegaly/portal hypertension.  Discussed with hospitalist Dr.Dahal  Thank you for allowing me to participate in the care of this patient.   Earlie Server, MD, PhD Hematology Oncology [facility] at [facility] Pager- [phone] 03/02/2021

## 2021-03-02 NOTE — Evaluation (Signed)
Physical Therapy Evaluation Patient Details Name: Carl Rivas MRN: [id number] DOB: 06/13/1956 Today's Date: 03/02/2021   History of Present Illness  Carl Rivas is a 65 y.o. male with PMH significant for morbid obesity, history of alcoholism, opioid use, cured hepatitis C, liver cirrhosis, thrombocytopenia, chronic anemia, hypertension, prolonged QT, lymphedema.  Patient lives at home alone, his brother lives close by.  Patient presented to the ED on 5/19 with ongoing generalized weakness and lower extremity edema, ongoing for a month.  Also reports intermittent shortness of breath as well as leg cramping.  Clinical Impression  Patient received in bed, reports he is feeling okay, having abdominal discomfort. Patient requires min assist for bed mobility and transfers. He is able to ambulate short distance to recliner then back to bed with RW and min guard. Patient will continue to benefit from skilled PT while here to improve strength, functional independence and safety.      Follow Up Recommendations Home health PT    Equipment Recommendations  Rolling walker with 5" wheels    Recommendations for Other Services       Precautions / Restrictions Precautions Precautions: Fall Restrictions Weight Bearing Restrictions: No      Mobility  Bed Mobility Overal bed mobility: Needs Assistance Bed Mobility: Supine to Sit;Sit to Supine     Supine to sit: Min assist;HOB elevated Sit to supine: Min assist   General bed mobility comments: patient requires assist to raise trunk and bring legs on and off bed    Transfers Overall transfer level: Needs assistance Equipment used: Rolling walker (2 wheeled) Transfers: Sit to/from Stand Sit to Stand: Min assist;From elevated surface         General transfer comment: cues for hand placement and to scoot to edge of seated surface. Patient requires min assist to power up to standing  Ambulation/Gait Ambulation/Gait assistance: Min  guard Gait Distance (Feet): 6 Feet   Gait Pattern/deviations: Step-to pattern;Decreased step length - right;Decreased step length - left Gait velocity: decreased   General Gait Details: patient ambulated 3 feet to recliner and 3 feet back to bed. Min guard.  Stairs            Wheelchair Mobility    Modified Rankin (Stroke Patients Only)       Balance Overall balance assessment: Needs assistance Sitting-balance support: Feet supported Sitting balance-Leahy Scale: Fair     Standing balance support: Bilateral upper extremity supported;During functional activity Standing balance-Leahy Scale: Fair Standing balance comment: requires UE support and min guard for safety                             Pertinent Vitals/Pain Pain Assessment: Faces Faces Pain Scale: Hurts little more Pain Location: abdomen Pain Descriptors / Indicators: Discomfort Pain Intervention(s): Monitored during session;Repositioned    Home Living Family/patient expects to be discharged to:: Private residence Living Arrangements: Alone Available Help at Discharge: Family;Available PRN/intermittently Type of Home: House         Home Equipment: Walker - 2 wheels;Cane - single point      Prior Function Level of Independence: Independent with assistive device(s)         Comments: patient normally uses cane at home.     Hand Dominance        Extremity/Trunk Assessment   Upper Extremity Assessment Upper Extremity Assessment: Generalized weakness;RUE deficits/detail RUE: Unable to fully assess due to pain RUE Coordination: decreased gross motor  Lower Extremity Assessment Lower Extremity Assessment: Generalized weakness       Communication   Communication: No difficulties  Cognition Arousal/Alertness: Awake/alert Behavior During Therapy: WFL for tasks assessed/performed Overall Cognitive Status: Within Functional Limits for tasks assessed                                         General Comments      Exercises     Assessment/Plan    PT Assessment Patient needs continued PT services  PT Problem List Decreased strength;Decreased mobility;Decreased activity tolerance;Decreased balance;Obesity       PT Treatment Interventions Therapeutic exercise;Gait training;Balance training;DME instruction;Functional mobility training;Therapeutic activities;Patient/family education    PT Goals (Current goals can be found in the Care Plan section)  Acute Rehab PT Goals Patient Stated Goal: to feel better PT Goal Formulation: With patient/family Time For Goal Achievement: 03/16/21 Potential to Achieve Goals: Good    Frequency Min 2X/week   Barriers to discharge Decreased caregiver support      Co-evaluation               AM-PAC PT "6 Clicks" Mobility  Outcome Measure Help needed turning from your back to your side while in a flat bed without using bedrails?: A Little Help needed moving from lying on your back to sitting on the side of a flat bed without using bedrails?: A Little Help needed moving to and from a bed to a chair (including a wheelchair)?: A Little Help needed standing up from a chair using your arms (e.g., wheelchair or bedside chair)?: A Little Help needed to walk in hospital room?: A Little Help needed climbing 3-5 steps with a railing? : A Lot 6 Click Score: 17    End of Session Equipment Utilized During Treatment: Gait belt Activity Tolerance: Patient tolerated treatment well Patient left: in bed;with family/visitor present;with call bell/phone within reach Nurse Communication: Mobility status PT Visit Diagnosis: Other abnormalities of gait and mobility (R26.89);Muscle weakness (generalized) (M62.81);Difficulty in walking, not elsewhere classified (R26.2)    Time: 8676-7209 ([id number]) PT Time Calculation (min) (ACUTE ONLY): 10 min   Charges:   PT Evaluation $PT Eval Moderate Complexity: 1 Mod PT  Treatments $Therapeutic Activity: 8-22 mins        Kenyotta Dorfman, PT, GCS 03/02/21,2:33 PM

## 2021-03-02 NOTE — Progress Notes (Signed)
PROGRESS NOTE  Carl Rivas  DOB: 1955-10-22  PCP: Kathyrn Lass VZD:[id number]  DOA: 02/28/2021  LOS: 1 day  Hospital Day: 3   Chief Complaint  Patient presents with  . Weakness   Brief narrative: Carl Rivas is a 65 y.o. male with PMH significant for morbid obesity, history of alcoholism, opioid use, cured hepatitis C, liver cirrhosis, thrombocytopenia, chronic anemia, hypertension, prolonged QT, lymphedema.  Patient lives at home alone, his brother lives close by. Patient presented to the ED on 5/19 with ongoing generalized weakness and lower extremity edema, ongoing for a month.  Also reports intermittent shortness of breath as well as leg cramping.  In the ED, hemodynamically stable, breathing on room air Potassium low at 2.8, hemoglobin low at 9.2, platelet low but stable at 70 Urinalysis significant for nitrites, leukocytes, bacteria, hemoglobin.   Admitted to hospitalist service for further evaluation management  5/20 CT chest, abdomen and pelvis with significant findings as below 1. Multiple large nodules are noted in the right lower lobe consistent with metastatic disease or primary malignancy. 2. Multiple ill-defined low densities in the hepatic parenchyma suggesting malignancy or metastatic disease.  3. Liver cirrhosis, portal hypertension, moderate ascites 4. Large periumbilical hernia is noted.  Subjective: Patient was seen and examined this morning. Middle-aged Caucasian male.  Looks older for his age. Lying on bed.  Not in distress. We discussed about the CT scan findings of potential malignancy.  He is interested in proceeding forward.  Assessment/Plan: New diagnosis of underlying malignancy with metastasis -5/20, CT CAP with multiple nodules and densities in right lower lobe as well as hepatic parenchyma -Unclear primary.  May need liver biopsy.  I ordered for cytology to be obtained, ascitic fluid tapping. -Patient wants to move ahead with cancer  treatment plan at this time. -We will discuss with IR and oncology  Liver cirrhosis/portal hypertension/moderate ascites -Liver cirrhosis secondary to h/o alcoholism, IV drugs and hepatitis C (cured).  Patient reports he is no longer using alcohol or drugs.  -Not on any medicines at home.  Does not follow-up with GI as an outpatient -Currently has moderate ascites and anasarca -Paracentesis ordered.  Compression bandage of both lower extremities ordered. -Blood pressure stable, creatinine stable.  I will start him on Lasix 40 mg IV daily at this time.  Complicated UTI -Urinalysis significant for nitrites, leukocytes, bacteria -Pending urine culture.  Currently on IV Rocephin.  Hypokalemia -Potassium low at 2.8 on admission, probably causing leg cramping symptoms. -Potassium level improved with replacement.  Monitor while on Lasix. Recent Labs  Lab 02/28/21 1634 03/01/21 0638 03/02/21 0521  K 2.8* 3.3* 3.5  MG 2.0  --  1.8  PHOS  --   --  2.7   Intermittent bloody stool Chronic anemia History of folic acid deficiency -Reports intermittent dark stool in the setting of liver cirrhosis.  Ordered for FOBT -Baseline hemoglobin was 12 a year ago, currently no evidence of active bleeding but hemoglobin is low at 8.8.   Recent Labs    02/28/21 1633 02/28/21 1634 02/28/21 1634 03/01/21 0638 03/02/21 0521  HGB  --  9.2*  --  8.2* 8.8*  MCV  --  100.4*   < > 100.8* 102.3*  VITAMINB12 1,824*  --   --   --   --   FOLATE 8.8  --   --   --   --   FERRITIN 183  --   --   --   --   TIBC  276  --   --   --   --   IRON 47  --   --   --   --   RETICCTPCT 3.7*  --   --   --   --    < > = values in this interval not displayed.   Thrombocytopenia -Running low.  Probably related to liver cirrhosis no active bleeding.  Continue to monitor Recent Labs  Lab 02/28/21 1634 03/01/21 0638 03/02/21 0521  PLT 70* 61* 65*   Prolonged QTC -QTC of 523.  Monitor electrolytes. -Avoid QT prolonging  medications -Recheck EKG in the morning.  Hypoalbuminemia -Albumin level low at 2.1, probably related to liver cirrhosis.   -Ordered for albumin replacement after paracentesis.  Chronic incarcerated abdominal hernia -He was recently at [facility] for chronic abdominal pain and evaluation which included imaging showing a chronic incarcerated hernia.  He was evaluated by general surgery which indicated no need for urgent intervention.  No significant change in the symptoms for him.  Chronic wounds- POA -Patient has chronic cellulitis, poor hygiene of bilateral lower extremities.  There is an open wound on the left lower leg and back of his thigh.  Discussed with wound care nurse.  No need of surgical debridement.  Local wound care recommended.  Mobility: Needs PT eval Code Status:   Code Status: Full Code  Nutritional status: Body mass index is 43.87 kg/m.     Diet Order            Diet Heart Room service appropriate? Yes; Fluid consistency: Thin  Diet effective now               Cardiac diet after CT scan. DVT prophylaxis: SCDs Start: 03/01/21 0002   Antimicrobials:  IV Rocephin Fluid: None Consultants: None Family Communication:  Called and discussed with patient's sister Ms. Butch Penny this afternoon.  Status is: Inpatient  Remains inpatient appropriate because: IV antibiotics, suspected malignancy.  Dispo: The patient is from: Home              Anticipated d/c is to: Unclear at this time              Patient currently is not medically stable to d/c.   Difficult to place patient No  Infusions:  . [START ON 03/04/2021] albumin human    . cefTRIAXone (ROCEPHIN)  IV      Scheduled Meds: . furosemide  40 mg Intravenous Daily  . Gerhardt's butt cream   Topical TID  . [START ON 03/03/2021] pneumococcal 23 valent vaccine  0.5 mL Intramuscular Tomorrow-1000  . sodium chloride flush  3 mL Intravenous Q12H    Antimicrobials: Anti-infectives (From admission, onward)   Start      Dose/Rate Route Frequency Ordered Stop   03/02/21 2200  cefTRIAXone (ROCEPHIN) 1 g in sodium chloride 0.9 % 100 mL IVPB        1 g 200 mL/hr over 30 Minutes Intravenous Every 24 hours 03/01/21 0046     02/28/21 2330  cefTRIAXone (ROCEPHIN) 1 g in sodium chloride 0.9 % 100 mL IVPB        1 g 200 mL/hr over 30 Minutes Intravenous  Once 02/28/21 2319 03/01/21 0100      PRN meds:    Objective: Vitals:   03/02/21 0515 03/02/21 0806  BP: 137/67 (!) 133/56  Pulse: 80 73  Resp: 18 15  Temp:    SpO2: 98% 99%    Intake/Output Summary (Last 24 hours) at  03/02/2021 1021 Last data filed at 03/02/2021 1020 Gross per 24 hour  Intake 240 ml  Output 1750 ml  Net -1510 ml   Filed Weights   02/28/21 1630 03/01/21 0500 03/02/21 0500  Weight: 127 kg (!) 136.2 kg (!) 138.7 kg   Weight change: 11.7 kg Body mass index is 43.87 kg/m.   Physical Exam: General exam: Middle-aged Caucasian male.  Looks older for his age. Skin: No rashes, lesions or ulcers. HEENT: Atraumatic, normocephalic, no obvious bleeding Lungs: Clear to auscultation bilaterally CVS: Regular rate and rhythm, no murmur GI/Abd soft, distended abdomen from ascites and hernia, mild tenderness, bowel sound present CNS: Alert, awake, oriented x3 Psychiatry: Depressed look Extremities: Chronic bilateral lower extremity stasis changes, poor hygiene, open wound on the left lower leg  Data Review: I have personally reviewed the laboratory data and studies available.  Recent Labs  Lab 02/28/21 1634 03/01/21 0638 03/02/21 0521  WBC 3.5* 3.0* 3.6*  NEUTROABS  --   --  2.3  HGB 9.2* 8.2* 8.8*  HCT 27.2* 24.2* 26.2*  MCV 100.4* 100.8* 102.3*  PLT 70* 61* 65*   Recent Labs  Lab 02/28/21 1634 03/01/21 0638 03/02/21 0521  NA 136 138 137  K 2.8* 3.3* 3.5  CL 106 110 108  CO2 24 24 25   GLUCOSE 99 97 94  BUN 13 12 11   CREATININE 0.82 0.80 0.70  CALCIUM 8.1* 7.8* 7.6*  MG 2.0  --  1.8  PHOS  --   --  2.7    F/u labs  ordered Unresulted Labs (From admission, onward)          Start     Ordered   03/02/21 0500  CBC with Differential/Platelet  Daily,   R     Question:  Specimen collection method  Answer:  Lab=Lab collect   03/01/21 1529   03/02/21 0500  Comprehensive metabolic panel  Daily,   R     Question:  Specimen collection method  Answer:  Lab=Lab collect   03/01/21 1529   03/01/21 0053  Occult blood card to lab, stool RN will collect  Once,   STAT       Question:  Specimen to be collected by:  Answer:  RN will collect   03/01/21 0052   02/28/21 2319  Urine culture  Add-on,   AD        02/28/21 2319          Signed, Terrilee Croak, MD [facility] 03/02/2021

## 2021-03-03 LAB — CBC WITH DIFFERENTIAL/PLATELET
Abs Immature Granulocytes: 0.02 10*3/uL (ref 0.00–0.07)
Basophils Absolute: 0.1 10*3/uL (ref 0.0–0.1)
Basophils Relative: 2 %
Eosinophils Absolute: 0.1 10*3/uL (ref 0.0–0.5)
Eosinophils Relative: 3 %
HCT: 28.7 % — ABNORMAL LOW (ref 39.0–52.0)
Hemoglobin: 9.5 g/dL — ABNORMAL LOW (ref 13.0–17.0)
Immature Granulocytes: 1 %
Lymphocytes Relative: 20 %
Lymphs Abs: 0.8 10*3/uL (ref 0.7–4.0)
MCH: 33.6 pg (ref 26.0–34.0)
MCHC: 33.1 g/dL (ref 30.0–36.0)
MCV: 101.4 fL — ABNORMAL HIGH (ref 80.0–100.0)
Monocytes Absolute: 0.5 10*3/uL (ref 0.1–1.0)
Monocytes Relative: 12 %
Neutro Abs: 2.6 10*3/uL (ref 1.7–7.7)
Neutrophils Relative %: 62 %
Platelets: 80 10*3/uL — ABNORMAL LOW (ref 150–400)
RBC: 2.83 MIL/uL — ABNORMAL LOW (ref 4.22–5.81)
RDW: 15.9 % — ABNORMAL HIGH (ref 11.5–15.5)
WBC: 4.1 10*3/uL (ref 4.0–10.5)
nRBC: 0 % (ref 0.0–0.2)

## 2021-03-03 LAB — COMPREHENSIVE METABOLIC PANEL
ALT: 20 U/L (ref 0–44)
AST: 116 U/L — ABNORMAL HIGH (ref 15–41)
Albumin: 2.1 g/dL — ABNORMAL LOW (ref 3.5–5.0)
Alkaline Phosphatase: 107 U/L (ref 38–126)
Anion gap: 5 (ref 5–15)
BUN: 13 mg/dL (ref 8–23)
CO2: 24 mmol/L (ref 22–32)
Calcium: 7.7 mg/dL — ABNORMAL LOW (ref 8.9–10.3)
Chloride: 107 mmol/L (ref 98–111)
Creatinine, Ser: 0.65 mg/dL (ref 0.61–1.24)
GFR, Estimated: >60 mL/min (ref >60)
Glucose, Bld: 124 mg/dL — ABNORMAL HIGH (ref 70–99)
Potassium: 2.8 mmol/L — ABNORMAL LOW (ref 3.5–5.1)
Sodium: 136 mmol/L (ref 135–145)
Total Bilirubin: 3.1 mg/dL — ABNORMAL HIGH (ref 0.3–1.2)
Total Protein: 6.4 g/dL — ABNORMAL LOW (ref 6.5–8.1)

## 2021-03-03 LAB — URINE CULTURE: Culture: >=100000 — AB

## 2021-03-03 LAB — OCCULT BLOOD X 1 CARD TO LAB, STOOL: Fecal Occult Bld: POSITIVE — AB

## 2021-03-03 MED ORDER — POTASSIUM CHLORIDE CRYS ER 20 MEQ PO TBCR
40.0000 meq | EXTENDED_RELEASE_TABLET | ORAL | Status: AC
  Administered 2021-03-03 (×3): 40 meq via ORAL
  Filled 2021-03-03 (×3): qty 2

## 2021-03-03 NOTE — TOC Progression Note (Signed)
Transition of Care [facility]) - Progression Note    Patient Details  Name: Carl Rivas MRN: [id number] Date of Birth: [birth year]  Transition of [facility]) CM/SW Contact  Zigmund Daniel Dorian Pod, RN Phone Number:[phone] 03/03/2021, 3:55 PM  Clinical Narrative:    Pt will need HHPT upon discharge. RN left voice message with charitable agency [facility]) requesting involvement.Currently pending call back.  TOC will continue to address ongoing needs accordingly.   Expected Discharge Plan: Home/Self Care Barriers to Discharge: Continued Medical Work up  Expected Discharge Plan and Services Expected Discharge Plan: Home/Self Care   Discharge Planning Services: CM Consult   Living arrangements for the past 2 months: Mobile Home                 DME Arranged: N/A DME Agency: NA       HH Arranged: NA HH Agency: NA         Social Determinants of Health (SDOH) Interventions    Readmission Risk Interventions No flowsheet data found.

## 2021-03-03 NOTE — Progress Notes (Signed)
PROGRESS NOTE  Carl Rivas  DOB: 03-Sep-1956  PCP: Kathyrn Lass [id number]  DOA: 02/28/2021  LOS: 2 days  Hospital Day: 4   Chief Complaint  Patient presents with  . Weakness   Brief narrative: Carl Rivas is a 65 y.o. male with PMH significant for morbid obesity, history of alcoholism, opioid use, cured hepatitis C, liver cirrhosis, thrombocytopenia, chronic anemia, hypertension, prolonged QT, lymphedema.  Patient lives at home alone, his brother lives close by. Patient presented to the ED on 5/19 with ongoing generalized weakness and lower extremity edema, ongoing for a month.  Also reports intermittent shortness of breath as well as leg cramping.  In the ED, hemodynamically stable, breathing on room air Potassium low at 2.8, hemoglobin low at 9.2, platelet low but stable at 70 Urinalysis significant for nitrites, leukocytes, bacteria, hemoglobin.   Admitted to hospitalist service for further evaluation management  5/20, CT chest, abdomen and pelvis with significant findings as below 1. Multiple large nodules are noted in the right lower lobe consistent with metastatic disease or primary malignancy. 2. Multiple ill-defined low densities in the hepatic parenchyma suggesting malignancy or metastatic disease.  3. Liver cirrhosis, portal hypertension, moderate ascites 4. Large periumbilical hernia is noted.  5/21, MRI abdomen was obtained which showed 1. Infiltrative mass in the liver with extensive tumor thrombus involving all branches of the portal vein, extending from the main portal vein into RIGHT and LEFT portal venous branches. Likely hepatocellular carcinoma with portal venous invasion, nodal and pulmonary metastatic disease.  2. Associated with large collateral pathways in the upper abdomen and large volume ascites.  Subjective: Patient was seen and examined this morning. Lying on bed.  Not in distress.  No new symptoms.  Potassium level low at 2.8 this morning.   Hemoglobin stable  Assessment/Plan: New diagnosis of underlying malignancy with metastasis -5/20, CT CAP with multiple nodules and densities in right lower lobe as well as hepatic parenchyma -5/21, MRI abdomen suggestive of hepatocellular carcinoma with portal venous invasion, nodal and pulmonary metastatic disease as well as extensive tumor thrombus involving all the branches of portal vein. -Oncology Dr. Tasia Catchings following. -Pending paracentesis.  Fluid analysis including cytology ordered.  Portal vein thrombosis -With MRI findings as above, portal vein invasion by hepatocellular carcinoma with extensive tumor thrombus involving all branches of the liver. -We will discuss with oncology regarding the benefit and risk of anticoagulation  Liver cirrhosis/portal hypertension/moderate ascites -Liver cirrhosis secondary to h/o alcoholism, IV drugs and hepatitis C (cured).  Patient reports he is no longer using alcohol or drugs.  -Not on any medicines at home.  Does not follow-up with GI as an outpatient -Currently has moderate ascites and anasarca -Paracentesis ordered.  Compression bandage of both lower extremities ordered. -5/21, started on Lasix IV 40 mg daily.  Continue the same. -Continue to monitor blood pressure, electrolytes and renal function  E. coli UTI -Urinalysis significant for nitrites, leukocytes, bacteria -Urine culture with more than 100,000 CFU per mL of E. Coli -Continue IV Rocephin.  Hypokalemia -Potassium low at 2.8 on admission, probably causing leg cramping symptoms. -Improved with replacement but down again to 2.8.  Lasix was started.  Oral replacement started.  -Continue to monitor potassium level. Recent Labs  Lab 02/28/21 1634 03/01/21 0638 03/02/21 0521 03/03/21 0449  K 2.8* 3.3* 3.5 2.8*  MG 2.0  --  1.8  --   PHOS  --   --  2.7  --    Intermittent bloody stool -  FOBT positive Chronic anemia History of folic acid deficiency -Reports intermittent dark  stool in the setting of liver cirrhosis.  -Baseline hemoglobin was 12 a year ago, presented with hemoglobin low at 9.2.  Despite FOBT positive, hemoglobin remains stable so far. Recent Labs    02/28/21 1633 02/28/21 1634 02/28/21 1634 03/01/21 0638 03/02/21 0521 03/03/21 0449  HGB  --  9.2*  --  8.2* 8.8* 9.5*  MCV  --  100.4*   < > 100.8* 102.3* 101.4*  VITAMINB12 1,824*  --   --   --   --   --   FOLATE 8.8  --   --   --   --   --   FERRITIN 183  --   --   --   --   --   TIBC 276  --   --   --   --   --   IRON 47  --   --   --   --   --   RETICCTPCT 3.7*  --   --   --   --   --    < > = values in this interval not displayed.   Thrombocytopenia -Running low.  Probably related to liver cirrhosis no active bleeding.  Continue to monitor Recent Labs  Lab 02/28/21 1634 03/01/21 0638 03/02/21 0521 03/03/21 0449  PLT 70* 61* 65* 80*   Prolonged QTC -QTC is prolonged in the setting of hypokalemia.  Continue to monitor potassium level. -Avoid QT prolonging medications  Hypoalbuminemia -Albumin level low at 2.1, probably related to liver cirrhosis.   -Ordered for albumin replacement after paracentesis.  Chronic incarcerated abdominal hernia -He was recently at [facility] for chronic abdominal pain and evaluation which included imaging showing a chronic incarcerated hernia.  He was evaluated by general surgery which indicated no need for urgent intervention.  No significant change in the symptoms for him.  Chronic wounds- POA -Patient has chronic cellulitis, poor hygiene of bilateral lower extremities.  There is an open wound on the left lower leg and back of his thigh.  Discussed with wound care nurse.  No need of surgical debridement.  Local wound care recommended.  Mobility: PT eval obtained.  Home health PT recommended. Code Status:   Code Status: Full Code  Nutritional status: Body mass index is 43.43 kg/m.     Diet Order            Diet Heart Room service appropriate? Yes;  Fluid consistency: Thin  Diet effective now               Cardiac diet after CT scan. DVT prophylaxis: SCDs Start: 03/01/21 0002   Antimicrobials:  IV Rocephin Fluid: None Consultants: None Family Communication:  Called and discussed with patient's sister Ms. Butch Penny on 5/21.  Status is: Inpatient  Remains inpatient appropriate because: IV antibiotics, suspected malignancy, pending work-up  Dispo: The patient is from: Home              Anticipated d/c is to: Unclear at this time              Patient currently is not medically stable to d/c.   Difficult to place patient No  Infusions:  . [START ON 03/04/2021] albumin human    . cefTRIAXone (ROCEPHIN)  IV 1 g (03/02/21 2032)    Scheduled Meds: . furosemide  40 mg Intravenous Daily  . Gerhardt's butt cream   Topical TID  . pneumococcal 23 valent  vaccine  0.5 mL Intramuscular Tomorrow-1000  . potassium chloride  40 mEq Oral Q2H  . sodium chloride flush  3 mL Intravenous Q12H    Antimicrobials: Anti-infectives (From admission, onward)   Start     Dose/Rate Route Frequency Ordered Stop   03/02/21 2200  cefTRIAXone (ROCEPHIN) 1 g in sodium chloride 0.9 % 100 mL IVPB        1 g 200 mL/hr over 30 Minutes Intravenous Every 24 hours 03/01/21 0046     02/28/21 2330  cefTRIAXone (ROCEPHIN) 1 g in sodium chloride 0.9 % 100 mL IVPB        1 g 200 mL/hr over 30 Minutes Intravenous  Once 02/28/21 2319 03/01/21 0100      PRN meds:    Objective: Vitals:   03/03/21 0531 03/03/21 0840  BP: (!) 160/95 137/76  Pulse: 86 76  Resp: 16 16  Temp: 98.2 F (36.8 C) 98.2 F (36.8 C)  SpO2: 99% 97%    Intake/Output Summary (Last 24 hours) at 03/03/2021 1033 Last data filed at 03/03/2021 1015 Gross per 24 hour  Intake 600 ml  Output 1770 ml  Net -1170 ml   Filed Weights   03/01/21 0500 03/02/21 0500 03/03/21 0939  Weight: (!) 136.2 kg (!) 138.7 kg (!) 137.3 kg   Weight change:  Body mass index is 43.43 kg/m.   Physical  Exam: General exam: Middle-aged Caucasian male.  Not in acute distress Skin: No rashes, lesions or ulcers. HEENT: Atraumatic, normocephalic, no obvious bleeding Lungs: Clear to auscultation bilaterally CVS: Regular rate and rhythm, no murmur GI/Abd soft, distended abdomen from ascites and hernia, mild tenderness, bowel sound present CNS: Alert, awake, oriented x3 Psychiatry: Depressed look Extremities: Chronic bilateral lower extremity stasis changes, poor hygiene, open wound on the left lower leg.  Both legs have Ace wraps on.  Data Review: I have personally reviewed the laboratory data and studies available.  Recent Labs  Lab 02/28/21 1634 03/01/21 0638 03/02/21 0521 03/03/21 0449  WBC 3.5* 3.0* 3.6* 4.1  NEUTROABS  --   --  2.3 2.6  HGB 9.2* 8.2* 8.8* 9.5*  HCT 27.2* 24.2* 26.2* 28.7*  MCV 100.4* 100.8* 102.3* 101.4*  PLT 70* 61* 65* 80*   Recent Labs  Lab 02/28/21 1634 03/01/21 0638 03/02/21 0521 03/03/21 0449  NA 136 138 137 136  K 2.8* 3.3* 3.5 2.8*  CL 106 110 108 107  CO2 24 24 25 24   GLUCOSE 99 97 94 124*  BUN 13 12 11 13   CREATININE 0.82 0.80 0.70 0.65  CALCIUM 8.1* 7.8* 7.6* 7.7*  MG 2.0  --  1.8  --   PHOS  --   --  2.7  --     F/u labs ordered Unresulted Labs (From admission, onward)          Start     Ordered   03/04/21 0500  Magnesium  Tomorrow morning,   R       Question:  Specimen collection method  Answer:  Lab=Lab collect   03/03/21 0806   03/04/21 0500  Phosphorus  Tomorrow morning,   R       Question:  Specimen collection method  Answer:  Lab=Lab collect   03/03/21 0806   03/03/21 0500  AFP tumor marker  Tomorrow morning,   R       Question:  Specimen collection method  Answer:  Lab=Lab collect   03/02/21 1410   03/02/21 0500  CBC with Differential/Platelet  Daily,   R     Question:  Specimen collection method  Answer:  Lab=Lab collect   03/01/21 1529   03/02/21 0500  Comprehensive metabolic panel  Daily,   R     Question:  Specimen  collection method  Answer:  Lab=Lab collect   03/01/21 1529          Signed, Terrilee Croak, MD [facility] 03/03/2021

## 2021-03-04 ENCOUNTER — Inpatient Hospital Stay: Payer: Self-pay

## 2021-03-04 LAB — COMPREHENSIVE METABOLIC PANEL
ALT: 21 U/L (ref 0–44)
AST: 122 U/L — ABNORMAL HIGH (ref 15–41)
Albumin: 2.1 g/dL — ABNORMAL LOW (ref 3.5–5.0)
Alkaline Phosphatase: 105 U/L (ref 38–126)
Anion gap: 4 — ABNORMAL LOW (ref 5–15)
BUN: 12 mg/dL (ref 8–23)
CO2: 24 mmol/L (ref 22–32)
Calcium: 7.7 mg/dL — ABNORMAL LOW (ref 8.9–10.3)
Chloride: 108 mmol/L (ref 98–111)
Creatinine, Ser: 0.65 mg/dL (ref 0.61–1.24)
GFR, Estimated: >60 mL/min (ref >60)
Glucose, Bld: 115 mg/dL — ABNORMAL HIGH (ref 70–99)
Potassium: 3.8 mmol/L (ref 3.5–5.1)
Sodium: 136 mmol/L (ref 135–145)
Total Bilirubin: 3.2 mg/dL — ABNORMAL HIGH (ref 0.3–1.2)
Total Protein: 6.4 g/dL — ABNORMAL LOW (ref 6.5–8.1)

## 2021-03-04 LAB — BODY FLUID CELL COUNT WITH DIFFERENTIAL
Lymphs, Fluid: 35 %
Monocyte-Macrophage-Serous Fluid: 55 %
Neutrophil Count, Fluid: 10 %
Total Nucleated Cell Count, Fluid: 281 cu mm

## 2021-03-04 LAB — ALBUMIN, PLEURAL OR PERITONEAL FLUID: Albumin, Fluid: <1 g/dL

## 2021-03-04 LAB — CBC WITH DIFFERENTIAL/PLATELET
Abs Immature Granulocytes: 0.01 10*3/uL (ref 0.00–0.07)
Basophils Absolute: 0.1 10*3/uL (ref 0.0–0.1)
Basophils Relative: 2 %
Eosinophils Absolute: 0.2 10*3/uL (ref 0.0–0.5)
Eosinophils Relative: 3 %
HCT: 28.9 % — ABNORMAL LOW (ref 39.0–52.0)
Hemoglobin: 9.9 g/dL — ABNORMAL LOW (ref 13.0–17.0)
Immature Granulocytes: 0 %
Lymphocytes Relative: 20 %
Lymphs Abs: 0.9 10*3/uL (ref 0.7–4.0)
MCH: 34.5 pg — ABNORMAL HIGH (ref 26.0–34.0)
MCHC: 34.3 g/dL (ref 30.0–36.0)
MCV: 100.7 fL — ABNORMAL HIGH (ref 80.0–100.0)
Monocytes Absolute: 0.6 10*3/uL (ref 0.1–1.0)
Monocytes Relative: 13 %
Neutro Abs: 2.9 10*3/uL (ref 1.7–7.7)
Neutrophils Relative %: 62 %
Platelets: 81 10*3/uL — ABNORMAL LOW (ref 150–400)
RBC: 2.87 MIL/uL — ABNORMAL LOW (ref 4.22–5.81)
RDW: 15.6 % — ABNORMAL HIGH (ref 11.5–15.5)
WBC: 4.6 10*3/uL (ref 4.0–10.5)
nRBC: 0 % (ref 0.0–0.2)

## 2021-03-04 LAB — LACTATE DEHYDROGENASE, PLEURAL OR PERITONEAL FLUID: LD, Fluid: 48 U/L — ABNORMAL HIGH (ref 3–23)

## 2021-03-04 LAB — PROTEIN, PLEURAL OR PERITONEAL FLUID: Total protein, fluid: <3 g/dL

## 2021-03-04 LAB — AFP TUMOR MARKER: AFP, Serum, Tumor Marker: 345 ng/mL — ABNORMAL HIGH (ref 0.0–8.4)

## 2021-03-04 LAB — GLUCOSE, PLEURAL OR PERITONEAL FLUID: Glucose, Fluid: 117 mg/dL

## 2021-03-04 LAB — MAGNESIUM: Magnesium: 2 mg/dL (ref 1.7–2.4)

## 2021-03-04 LAB — PHOSPHORUS: Phosphorus: 2.2 mg/dL — ABNORMAL LOW (ref 2.5–4.6)

## 2021-03-04 MED ORDER — CEPHALEXIN 500 MG PO CAPS
500.0000 mg | ORAL_CAPSULE | Freq: Two times a day (BID) | ORAL | Status: DC
  Administered 2021-03-04 – 2021-03-06 (×6): 500 mg via ORAL
  Filled 2021-03-04 (×6): qty 1

## 2021-03-04 MED ORDER — K PHOS MONO-SOD PHOS DI & MONO 155-852-130 MG PO TABS
500.0000 mg | ORAL_TABLET | Freq: Once | ORAL | Status: AC
  Administered 2021-03-04: 500 mg via ORAL
  Filled 2021-03-04: qty 2

## 2021-03-04 NOTE — Procedures (Signed)
Ultrasound-guided diagnostic and therapeutic paracentesis performed yielding 5 liters of straw colored fluid.  Fluid was sent to lab for analysis. No immediate complications. EBL is none.  

## 2021-03-04 NOTE — Progress Notes (Signed)
PROGRESS NOTE  Carl Rivas  DOB: 08-27-1956  PCP: Kathyrn Lass MGQ:[id number]  DOA: 02/28/2021  LOS: 3 days  Hospital Day: 5   Chief Complaint  Patient presents with  . Weakness   Brief narrative: Carl Rivas is a 65 y.o. male with PMH significant for morbid obesity, history of alcoholism, opioid use, cured hepatitis C, liver cirrhosis, thrombocytopenia, chronic anemia, hypertension, prolonged QT, lymphedema.  Patient lives at home alone, his brother lives close by. Patient presented to the ED on 5/19 with ongoing generalized weakness and lower extremity edema, ongoing for a month.  Also reports intermittent shortness of breath as well as leg cramping.  In the ED, hemodynamically stable, breathing on room air Potassium low at 2.8, hemoglobin low at 9.2, platelet low but stable at 70 Urinalysis significant for nitrites, leukocytes, bacteria, hemoglobin.   Admitted to hospitalist service for further evaluation management  5/20, CT chest, abdomen and pelvis with significant findings as below 1. Multiple large nodules are noted in the right lower lobe consistent with metastatic disease or primary malignancy. 2. Multiple ill-defined low densities in the hepatic parenchyma suggesting malignancy or metastatic disease.  3. Liver cirrhosis, portal hypertension, moderate ascites 4. Large periumbilical hernia is noted.  5/21, MRI abdomen was obtained which showed 1. Infiltrative mass in the liver with extensive tumor thrombus involving all branches of the portal vein, extending from the main portal vein into RIGHT and LEFT portal venous branches. Likely hepatocellular carcinoma with portal venous invasion, nodal and pulmonary metastatic disease.  2. Associated with large collateral pathways in the upper abdomen and large volume ascites.  Subjective: Patient was seen and examined this morning. On bed.  Not in distress.  No new symptoms.  Pending paracentesis today. Labs from this  morning with phosphorus level low at 2.2.  Assessment/Plan: New diagnosis of underlying malignancy with metastasis -5/20, CT CAP with multiple nodules and densities in right lower lobe as well as hepatic parenchyma -5/21, MRI abdomen suggestive of hepatocellular carcinoma with portal venous invasion, nodal and pulmonary metastatic disease as well as extensive tumor thrombus involving all the branches of portal vein. -Oncology Dr. Tasia Catchings following. -Pending paracentesis today.  Fluid analysis including cytology ordered.  May need biopsy by IR as well.  Defer to oncology.  Portal vein thrombosis -With MRI findings as above, portal vein invasion by hepatocellular carcinoma with extensive tumor thrombus involving all branches of the liver. -We will discuss with oncology regarding the benefit and risk of anticoagulation.  Liver cirrhosis/portal hypertension/moderate ascites -Liver cirrhosis secondary to h/o alcoholism, IV drugs and hepatitis C (cured).  Patient reports he is no longer using alcohol or drugs.  -Not on any medicines at home.  Does not follow-up with GI as an outpatient -Currently has moderate ascites and anasarca -Paracentesis ordered.  Currently has compression bandage on both lower extremities. -5/21, started on Lasix IV 40 mg daily.  Continue the same. -Continue to monitor blood pressure, electrolytes and renal function  E. coli UTI -Urinalysis significant for nitrites, leukocytes, bacteria -Urine culture with more than 100,000 CFU per mL of E. Coli, pansensitive. -Continue IV Rocephin for a 5-day course  Hypokalemia -Potassium low at 2.8 on admission, probably causing leg cramping symptoms. -Improved with replacement but down again to 2.8 on 5/22, probably because of Lasix. -Phosphorus level down to 2.2 today.  Replacement ordered.  Continue to monitor. Recent Labs  Lab 02/28/21 1634 03/01/21 0638 03/02/21 0521 03/03/21 0449 03/04/21 0413  K 2.8* 3.3* 3.5 2.8*  3.8  MG  2.0  --  1.8  --  2.0  PHOS  --   --  2.7  --  2.2*   Intermittent bloody stool - FOBT positive Chronic anemia History of folic acid deficiency -Reports intermittent dark stool in the setting of liver cirrhosis.  -Baseline hemoglobin was 12 a year ago, presented with hemoglobin low at 9.2.  Despite FOBT positive, hemoglobin remains stable so far. Recent Labs    02/28/21 1633 02/28/21 1634 02/28/21 1634 03/01/21 8185 03/02/21 0521 03/03/21 0449 03/04/21 0413  HGB  --  9.2*  --  8.2* 8.8* 9.5* 9.9*  MCV  --  100.4*   < > 100.8* 102.3* 101.4* 100.7*  VITAMINB12 1,824*  --   --   --   --   --   --   FOLATE 8.8  --   --   --   --   --   --   FERRITIN 183  --   --   --   --   --   --   TIBC 276  --   --   --   --   --   --   IRON 47  --   --   --   --   --   --   RETICCTPCT 3.7*  --   --   --   --   --   --    < > = values in this interval not displayed.   Thrombocytopenia -Running low.  Probably related to liver cirrhosis no active bleeding.  Continue to monitor Recent Labs  Lab 02/28/21 1634 03/01/21 0638 03/02/21 0521 03/03/21 0449 03/04/21 0413  PLT 70* 61* 65* 80* 81*   Prolonged QTC -QTC is prolonged in the setting of hypokalemia.  Continue to monitor potassium level. -Avoid QT prolonging medications  Hypoalbuminemia -Albumin level low at 2.1, probably related to liver cirrhosis.   -Ordered for albumin replacement after paracentesis.  Chronic incarcerated abdominal hernia -He was recently at [facility] for chronic abdominal pain and evaluation which included imaging showing a chronic incarcerated hernia.  He was evaluated by general surgery which indicated no need for urgent intervention.  No significant change in the symptoms for him.  Chronic wounds- POA -Patient has chronic cellulitis, poor hygiene of bilateral lower extremities.  There is an open wound on the left lower leg and back of his thigh.  Discussed with wound care nurse.  No need of surgical debridement.   Local wound care recommended.  Mobility: PT eval obtained.  Home health PT recommended. Code Status:   Code Status: Full Code  Nutritional status: Body mass index is 42.51 kg/m.     Diet Order            Diet Heart Room service appropriate? Yes; Fluid consistency: Thin  Diet effective now               Cardiac diet after CT scan. DVT prophylaxis: SCDs Start: 03/01/21 0002   Antimicrobials:  IV Rocephin Fluid: None Consultants: None Family Communication:  Patient's sister Ms. Butch Penny updated.  Status is: Inpatient  Remains inpatient appropriate because: IV antibiotics, suspected malignancy, pending work-up  Dispo: The patient is from: Home              Anticipated d/c is to: Home health PT in next 2 to 3 days.              Patient currently is not medically stable  to d/c.   Difficult to place patient No  Infusions:  . albumin human    . cefTRIAXone (ROCEPHIN)  IV 1 g (03/03/21 2125)    Scheduled Meds: . furosemide  40 mg Intravenous Daily  . Gerhardt's butt cream   Topical TID  . phosphorus  500 mg Oral Once  . pneumococcal 23 valent vaccine  0.5 mL Intramuscular Tomorrow-1000  . sodium chloride flush  3 mL Intravenous Q12H    Antimicrobials: Anti-infectives (From admission, onward)   Start     Dose/Rate Route Frequency Ordered Stop   03/02/21 2200  cefTRIAXone (ROCEPHIN) 1 g in sodium chloride 0.9 % 100 mL IVPB        1 g 200 mL/hr over 30 Minutes Intravenous Every 24 hours 03/01/21 0046     02/28/21 2330  cefTRIAXone (ROCEPHIN) 1 g in sodium chloride 0.9 % 100 mL IVPB        1 g 200 mL/hr over 30 Minutes Intravenous  Once 02/28/21 2319 03/01/21 0100      PRN meds:    Objective: Vitals:   03/04/21 0805 03/04/21 1025  BP: (!) 144/67 (!) 147/72  Pulse: 83 81  Resp: 20 18  Temp: 98.4 F (36.9 C)   SpO2: 96% 94%    Intake/Output Summary (Last 24 hours) at 03/04/2021 1052 Last data filed at 03/04/2021 1030 Gross per 24 hour  Intake 120 ml  Output  3600 ml  Net -3480 ml   Filed Weights   03/02/21 0500 03/03/21 0939 03/04/21 0500  Weight: (!) 138.7 kg (!) 137.3 kg 134.4 kg   Weight change:  Body mass index is 42.51 kg/m.   Physical Exam: General exam: Middle-aged Caucasian male.  No acute distress. Skin: No rashes, lesions or ulcers. HEENT: Atraumatic, normocephalic, no obvious bleeding Lungs: Clear to auscultation bilaterally CVS: Regular rate and rhythm, no murmur GI/Abd soft, distended abdomen from ascites and hernia, mild tenderness, bowel sound present CNS: Alert, awake, oriented x3 Psychiatry: Depressed look Extremities: Chronic bilateral lower extremity stasis changes, poor hygiene, open wound on the left lower leg.  Both legs have Ace wraps on.  Data Review: I have personally reviewed the laboratory data and studies available.  Recent Labs  Lab 02/28/21 1634 03/01/21 [id number] 03/02/21 0521 03/03/21 0449 03/04/21 0413  WBC 3.5* 3.0* 3.6* 4.1 4.6  NEUTROABS  --   --  2.3 2.6 2.9  HGB 9.2* 8.2* 8.8* 9.5* 9.9*  HCT 27.2* 24.2* 26.2* 28.7* 28.9*  MCV 100.4* 100.8* 102.3* 101.4* 100.7*  PLT 70* 61* 65* 80* 81*   Recent Labs  Lab 02/28/21 1634 03/01/21 0638 03/02/21 0521 03/03/21 0449 03/04/21 0413  NA 136 138 137 136 136  K 2.8* 3.3* 3.5 2.8* 3.8  CL 106 110 108 107 108  CO2 24 24 25 24 24   GLUCOSE 99 97 94 124* 115*  BUN 13 12 11 13 12   CREATININE 0.82 0.80 0.70 0.65 0.65  CALCIUM 8.1* 7.8* 7.6* 7.7* 7.7*  MG 2.0  --  1.8  --  2.0  PHOS  --   --  2.7  --  2.2*    F/u labs ordered Unresulted Labs (From admission, onward)          Start     Ordered   03/04/21 1024  Lactate dehydrogenase (pleural or peritoneal fluid)  RELEASE UPON ORDERING,   TIMED        03/04/21 1024   03/04/21 1024  Body fluid cell count with differential  RELEASE UPON ORDERING,   TIMED        03/04/21 1024   03/04/21 1024  Albumin, pleural or peritoneal fluid  RELEASE UPON ORDERING,   TIMED        03/04/21 1024   03/04/21 1024   Protein, pleural or peritoneal fluid  RELEASE UPON ORDERING,   TIMED        03/04/21 1024   03/04/21 1024  Glucose, pleural or peritoneal fluid  RELEASE UPON ORDERING,   TIMED        03/04/21 1024   03/04/21 1024  Body fluid culture w Gram Stain  RELEASE UPON ORDERING,   TIMED        03/04/21 1024   03/03/21 0500  AFP tumor marker  Tomorrow morning,   R       Question:  Specimen collection method  Answer:  Lab=Lab collect   03/02/21 1410          Signed, Terrilee Croak, MD [facility] 03/04/2021

## 2021-03-05 ENCOUNTER — Other Ambulatory Visit: Payer: Self-pay | Admitting: Oncology

## 2021-03-05 DIAGNOSIS — K921 Melena: Secondary | ICD-10-CM

## 2021-03-05 DIAGNOSIS — Z7189 Other specified counseling: Secondary | ICD-10-CM

## 2021-03-05 DIAGNOSIS — Z66 Do not resuscitate: Secondary | ICD-10-CM

## 2021-03-05 DIAGNOSIS — Z515 Encounter for palliative care: Secondary | ICD-10-CM

## 2021-03-05 DIAGNOSIS — Z8619 Personal history of other infectious and parasitic diseases: Secondary | ICD-10-CM

## 2021-03-05 LAB — CYTOLOGY - NON PAP

## 2021-03-05 LAB — PROTEIN, BODY FLUID (OTHER): Total Protein, Body Fluid Other: 0.8 g/dL

## 2021-03-05 MED ORDER — OXYCODONE-ACETAMINOPHEN 5-325 MG PO TABS
1.0000 | ORAL_TABLET | ORAL | Status: DC | PRN
  Administered 2021-03-05 – 2021-03-08 (×14): 2 via ORAL
  Filled 2021-03-05 (×2): qty 2
  Filled 2021-03-05: qty 1
  Filled 2021-03-05 (×11): qty 2

## 2021-03-05 NOTE — Consult Note (Addendum)
Consultation Note Date: 03/05/2021   Patient Name: Carl Rivas  DOB: 1956/03/24  MRN: [id number]  Age / Sex: 65 y.o., male   PCP: Pcp, No Referring Physician: Terrilee Croak, MD   REASON FOR CONSULTATION:Establishing goals of care  Palliative Care consult requested for goals of care discussion in this 65 y.o. male with a medical history significant for hepatitis C, cytopenia, obesity, anemia, hypertension, alcoholic liver cirrhosis, lymphedema, and alcohol abuse.  Presented from home with generalized weakness and bilateral lower extremity edema x1 month.  During work-up you a positive for UTI.  CT of chest abdomen and pelvis showed multiple large nodules in the right lower lobe concerning for metastatic disease or primary malignancy.  Ligament ill-defined low-density hepatic parenchyma suggesting malignancy or metastatic disease.  Cirrhosis, portal hypertension with moderate ascites.  Multiple liver mass.  Oncology consulted and following.  Patient is s/p paracentesis yielding 5 L (03/04/2021).  Clinical Assessment and Goals of Care: I have reviewed medical records including lab results, imaging, Epic notes, and MAR, received report from the bedside RN, and assessed the patient.   I met at the bedside with Mr. Carl Rivas to discuss diagnosis prognosis, [place], EOL wishes, disposition and options.  He is sitting up in bed watching television.  Patient is awake, alert and oriented x3.  Complains of some abdominal discomfort.  I introduced Palliative Medicine as specialized medical care for people living with serious illness. It focuses on providing relief from the symptoms and stress of a serious illness. The goal is to improve quality of life for both the patient and the family.  We discussed a brief life review of the patient, along with his functional and nutritional status.  Patient reports he has never married and has no children.  He has 4 siblings who are very supportive.  His brother  Carl Rivas and his Sister Butch Penny are closely involved in his care.  Patient lives alone on the same property as his brother Carl Rivas.  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  He has 2 cats.  Prior to admission patient reports a continued decline in his health.  Endorses decreased appetite with weight loss (unknown amount).  Patient states he is able to perform all ADLs independently however is easily fatigued with some shortness of breath.  Increased weakness in lower extremities.  States he uses a walker or cane in the home due to unsteady gait and fear from falling.  Endorses multiple falls over the past 6 months due to leg weakness.  States he spends most of his day in the chair.  We discussed His current illness and what it means in the larger context of His on-going co-morbidities. Natural disease trajectory and expectations at EOL were discussed.  Patient requested to include his brother Carl Rivas and discussions.  Carl Rivas was involved via phone.  Patient and brother both verbalized their understanding of his current illness and ongoing comorbidities.  They are realistic in their understanding and expectations of patient's newly found lung and liver masses which are questionable for metastatic disease.  Nylan states "it is what he is.  I am ready to go when it is my time and is looking forward to seeing my grandparents and parents in heaven!" Emotional support provided. Carl Rivas states he also told his sister the same thing on last night and that he was at peace with his prognosis.   A detailed discussion was had today regarding advanced directives.  Concepts specific to code status, artifical feeding and hydration, continued IV antibiotics and rehospitalization.  Geno does have an advanced directive naming his brother Carl Rivas and his Sister Carl Rivas as his medical decision makers.  He is clear and expressed wishes for no artificial feeding.   We discussed at  length his current full code status with consideration of his current illness and co-morbidities. Patient and brother both verbalized understanding of his terminal illness. They are mutually requesting DNR/DNI. Patient states he would not want heroic measures and to have a natural death.   The difference between a aggressive medical intervention and a palliative comfort care path were discussed at length. Values and goals of care important to patient and family were attempted to be elicited.   Hospice and Palliative Care services outpatient were explained and offered. Patient and family verbalized their understanding and awareness of both palliative and hospice's goals and philosophy of care. Recommendations for hospice provided given terminal illness with poor prognosis and no life-sustaining medial interventions available.   Patient and brother are clear in expressed goals for no further work-up and their understanding of terminal illness. They are leaning towards patient returning home with family support and focusing on his comfort. Brother is requesting time to speak with their sister Butch Penny and further discuss.    I discussed the importance of continued conversation with family and their medical providers regarding overall plan of care and treatment options, ensuring decisions are within the context of the patients values and GOCs.  Questions and concerns were addressed.  Hard Choices booklet left for review. The family was encouraged to call with questions or concerns.  PMT will continue to support holistically as needed.   CODE STATUS: DNR  ADVANCE DIRECTIVES: Primary Decision Maker: Patient, brother Carl Rivas) and sister Butch Penny)    SYMPTOM MANAGEMENT: see below   Palliative Prophylaxis:   Frequent Pain Assessment  PSYCHO-SOCIAL/SPIRITUAL:  Support System: Family  Desire for further Chaplaincy support: NO  Additional Recommendations (Limitations, Scope, Preferences):  No Artificial  Feeding, No Surgical Procedures and family in ongoing discussions, leaning towards hospice and comfort. no escalation of care  Education on hospice/palliative    PAST MEDICAL HISTORY: Past Medical History:  Diagnosis Date  . Cirrhosis ([place])   . Hepatitis C virus infection cured after antiviral drug therapy     ALLERGIES:  is allergic to penicillins.   MEDICATIONS:  Current Facility-Administered Medications  Medication Dose Route Frequency Provider Last Rate Last Admin  . albumin human 25 % solution 25 g  25 g Intravenous UD Dahal, Binaya, MD      . cephALEXin (KEFLEX) capsule 500 mg  500 mg Oral Q12H Dahal, Binaya, MD   500 mg at 03/05/21 0910  . furosemide (LASIX) injection 40 mg  40 mg Intravenous Daily Dahal, Marlowe Aschoff, MD   40 mg at 03/05/21 0909  . Gerhardt's butt cream   Topical TID Terrilee Croak, MD   Given at 03/05/21 0910  . oxyCODONE-acetaminophen (PERCOCET/ROXICET) 5-325 MG per tablet 1 tablet  1 tablet Oral Q6H PRN Terrilee Croak, MD   1 tablet at 03/05/21 0909  . sodium chloride (OCEAN) 0.65 % nasal spray 1 spray  1 spray Each Nare PRN Dahal, Binaya, MD      . sodium chloride flush (NS) 0.9 % injection 3 mL  3 mL Intravenous Q12H Marcelyn Bruins, MD   3 mL at 03/05/21 0910    VITAL SIGNS: BP 140/64 (BP Location: Right Arm)   Pulse 91  Temp 98.2 F (36.8 C)   Resp 16   Ht _0  (1.778 m)   Wt 123 kg   SpO2 98%   BMI 38.91 kg/m  Filed Weights   03/03/21 0939 03/04/21 0500 03/05/21 0500  Weight: (!) 137.3 kg 134.4 kg 123 kg    Estimated body mass index is 38.91 kg/m as calculated from the following:   Height as of this encounter: _1  (1.778 m).   Weight as of this encounter: 123 kg.  LABS: CBC:    Component Value Date/Time   WBC 4.6 03/04/2021 0413   HGB 9.9 (L) 03/04/2021 0413   HCT 28.9 (L) 03/04/2021 0413   PLT 81 (L) 03/04/2021 0413   Comprehensive Metabolic Panel:    Component Value Date/Time   NA 136 03/04/2021 0413   K 3.8 03/04/2021  0413   BUN 12 03/04/2021 0413   CREATININE 0.65 03/04/2021 0413   ALBUMIN 2.1 (L) 03/04/2021 0413     Review of Systems  Neurological: Positive for weakness.  Unless otherwise noted, a complete review of systems is negative.  Physical Exam General: NAD, chronically-ill appearing Cardiovascular: regular rate and rhythm Pulmonary: diminished bilaterally  Abdomen: soft, tender, distended, hiatal hernia, + bowel sounds Extremities: no edema, no joint deformities Skin: no rashes, warm and dry, bilateral lower extremity stasis Neurological: AAOx3, mood appropriate  Prognosis: < 6 months in the setting of metastatic cancer with multiple lung nodules, liver masses, liver cirrhosis, thrombocytopenia, bilateral lower extremities ulceration, albumin 2.1, portal hypertension, moderate ascites s/p paracentesis 5L.   Discharge Planning:  ongoing discussion family leaning towards comfort.   Recommendations: . DNR/DNI-as requested and confirmed by patient and brother, Carl Rivas [facility]).  . Continue with current plan of care, leaning towards home with hospice. Requesting time to have ongoing family discussion.  . Patient and brother, Carl Rivas are clear in expressed goals and realistic in their understanding of poor prognosis. Patient states wishes are to be home with family to spend what time he has left.  . Increase Percocet 1-2 prn for pain . Recommendations for outpatient hospice. Brother plans to call once final decisions made.   Marland Kitchen PMT will continue to support and follow as needed. Please call team line with urgent needs.   Palliative Performance Scale: PPS 30%                Patient and expressed understanding and was in agreement with this plan.   Thank you for allowing the Palliative Medicine Team to assist in the care of this patient. Please utilize secure chat with additional questions, if there is no response within 30 minutes please call the above phone number.   Time In: 0915 Time Out:  1020 Time Total: 65 min.   Visit consisted of counseling and education dealing with the complex and emotionally intense issues of symptom management and palliative care in the setting of serious and potentially life-threatening illness.Greater than 50%  of this time was spent counseling and coordinating care related to the above assessment and plan.  Signed by:  Alda Lea, AGPCNP-BC Palliative Medicine Team  Phone: [phone] Pager: [phone] Amion: [facility] Team providers are available by phone from 7am to 7pm daily and can be reached through the team cell phone.  Should this patient require assistance outside of these hours, please call the patient's attending physician.

## 2021-03-05 NOTE — Progress Notes (Signed)
PROGRESS NOTE  Carl Rivas  DOB: [birth year]  PCP: Kathyrn Lass OIN:[id number]  DOA: 02/28/2021  LOS: 4 days  Hospital Day: 6   Chief Complaint  Patient presents with  . Weakness   Brief narrative: Carl Rivas is a 65 y.o. male with PMH significant for morbid obesity, history of alcoholism, opioid use, cured hepatitis C, liver cirrhosis, thrombocytopenia, chronic anemia, hypertension, prolonged QT, lymphedema.  Patient lives at home alone, his brother lives close by. Patient presented to the ED on 5/19 with ongoing generalized weakness and lower extremity edema, ongoing for a month.  Also reports intermittent shortness of breath as well as leg cramping.  In the ED, hemodynamically stable, breathing on room air Potassium low at 2.8, hemoglobin low at 9.2, platelet low but stable at 70 Urinalysis significant for nitrites, leukocytes, bacteria, hemoglobin.   Admitted to hospitalist service for further evaluation management  5/20, CT chest, abdomen and pelvis with significant findings as below 1. Multiple large nodules are noted in the right lower lobe consistent with metastatic disease or primary malignancy. 2. Multiple ill-defined low densities in the hepatic parenchyma suggesting malignancy or metastatic disease.  3. Liver cirrhosis, portal hypertension, moderate ascites 4. Large periumbilical hernia is noted.  5/21, MRI abdomen was obtained which showed 1. Infiltrative mass in the liver with extensive tumor thrombus involving all branches of the portal vein, extending from the main portal vein into RIGHT and LEFT portal venous branches. Likely hepatocellular carcinoma with portal venous invasion, nodal and pulmonary metastatic disease.  2. Associated with large collateral pathways in the upper abdomen and large volume ascites.  Subjective: Patient was seen and examined this morning. Lying on bed.  Not in distress.  No new symptoms except for intermittent episodes of pain    Assessment/Plan: New diagnosis of underlying malignancy with metastasis -5/20, CT CAP with multiple nodules and densities in right lower lobe as well as hepatic parenchyma -5/21, MRI abdomen suggestive of hepatocellular carcinoma with portal venous invasion, nodal and pulmonary metastatic disease as well as extensive tumor thrombus involving all the branches of portal vein. -Oncology consult obtained with Dr. Tasia Catchings.  Pending cytology of ascitic fluid.  Per Dr. Tasia Catchings, patient does not get liver biopsy as [place]vent can be diagnosed with imaging. -Because of his severe cirrhosis, his treatment options are extremely limited.  Recommended hospice care. -Palliative care consult ordered.  Portal vein thrombosis -With MRI findings as above, portal vein invasion by hepatocellular carcinoma with extensive tumor thrombus involving all branches of the liver. -We will discuss with oncology regarding the benefit and risk of anticoagulation.  Liver cirrhosis/portal hypertension/moderate ascites -Liver cirrhosis secondary to h/o alcoholism, IV drugs and hepatitis C (cured).  Patient reports he is no longer using alcohol or drugs.  -Not on any medicines at home.  Does not follow-up with GI as an outpatient -Currently has moderate ascites and anasarca -5/23 underwent paracentesis, 5 L of straw-colored ascitic fluid drained.   -Currently has compression bandage on both lower extremities. -5/21, started on Lasix IV 40 mg daily.  Continue the same. -Continue to monitor blood pressure, electrolytes and renal function  E. coli UTI -Urinalysis significant for nitrites, leukocytes, bacteria -Urine culture with more than 100,000 CFU per mL of E. Coli, pansensitive. -To complete 5-day course of IV Rocephin today.  Hypokalemia/hypophosphatemia -Due to poor nutrition as well as IV Lasix.  Replacement ordered.  Recheck tomorrow. Recent Labs  Lab 02/28/21 1634 03/01/21 7096 03/02/21 0521 03/03/21 0449 03/04/21 0413  K  2.8* 3.3* 3.5 2.8* 3.8  MG 2.0  --  1.8  --  2.0  PHOS  --   --  2.7  --  2.2*   Intermittent bloody stool - FOBT positive Chronic anemia History of folic acid deficiency -Reports intermittent dark stool in the setting of liver cirrhosis.  -Baseline hemoglobin was 12 a year ago, presented with hemoglobin low at 9.2.  Despite FOBT positive, hemoglobin remains stable so far. Recent Labs    02/28/21 1633 02/28/21 1634 02/28/21 1634 03/01/21 9622 03/02/21 0521 03/03/21 0449 03/04/21 0413  HGB  --  9.2*  --  8.2* 8.8* 9.5* 9.9*  MCV  --  100.4*   < > 100.8* 102.3* 101.4* 100.7*  VITAMINB12 1,824*  --   --   --   --   --   --   FOLATE 8.8  --   --   --   --   --   --   FERRITIN 183  --   --   --   --   --   --   TIBC 276  --   --   --   --   --   --   IRON 47  --   --   --   --   --   --   RETICCTPCT 3.7*  --   --   --   --   --   --    < > = values in this interval not displayed.   Thrombocytopenia -Running low.  Probably related to liver cirrhosis no active bleeding.  Continue to monitor Recent Labs  Lab 02/28/21 1634 03/01/21 0638 03/02/21 0521 03/03/21 0449 03/04/21 0413  PLT 70* 61* 65* 80* 81*   Prolonged QTC -QTC is prolonged in the setting of hypokalemia.  Continue to monitor potassium level. -Avoid QT prolonging medications  Hypoalbuminemia -Albumin level low at 2.1, probably related to liver cirrhosis.   -Ordered for albumin replacement after paracentesis.  Chronic incarcerated abdominal hernia -He was recently at [facility] for chronic abdominal pain and evaluation which included imaging showing a chronic incarcerated hernia.  He was evaluated by general surgery which indicated no need for urgent intervention.  No significant change in the symptoms for him.  Chronic wounds- POA -Patient has chronic cellulitis, poor hygiene of bilateral lower extremities.  There is an open wound on the left lower leg and back of his thigh.  Discussed with wound care nurse.  No need  of surgical debridement.  Local wound care recommended.  Goals of care -Poor prognosis because of advanced liver cirrhosis and a new diagnosis of hepatocellular carcinoma.  Hospice service recommended.  Palliative care consult obtained.  Patient and family agreed to DNR status at this time.  They are discussing about the hospice recommendation.  Mobility: PT eval obtained. Code Status:   Code Status: DNR  Nutritional status: Body mass index is 38.91 kg/m.     Diet Order            Diet Heart Room service appropriate? Yes; Fluid consistency: Thin  Diet effective now               Cardiac diet after CT scan. DVT prophylaxis: SCDs Start: 03/01/21 0002   Antimicrobials:  To complete course of IV Rocephin today Fluid: None Consultants: None Family Communication:  Patient's sister Ms. Butch Penny and brother Mr. [name] updated.  Status is: Inpatient  Dispo: The patient is from: Home  Anticipated d/c is to: Home health PT versus home hospice              Patient currently is not medically stable to d/c.   Difficult to place patient No  Infusions:  . albumin human      Scheduled Meds: . cephALEXin  500 mg Oral Q12H  . furosemide  40 mg Intravenous Daily  . Gerhardt's butt cream   Topical TID  . sodium chloride flush  3 mL Intravenous Q12H    Antimicrobials: Anti-infectives (From admission, onward)   Start     Dose/Rate Route Frequency Ordered Stop   03/04/21 1245  cephALEXin (KEFLEX) capsule 500 mg        500 mg Oral Every 12 hours 03/04/21 1147     03/02/21 2200  cefTRIAXone (ROCEPHIN) 1 g in sodium chloride 0.9 % 100 mL IVPB  Status:  Discontinued        1 g 200 mL/hr over 30 Minutes Intravenous Every 24 hours 03/01/21 0046 03/04/21 1147   02/28/21 2330  cefTRIAXone (ROCEPHIN) 1 g in sodium chloride 0.9 % 100 mL IVPB        1 g 200 mL/hr over 30 Minutes Intravenous  Once 02/28/21 2319 03/01/21 0100      PRN meds:    Objective: Vitals:   03/05/21 0420  03/05/21 0805  BP: 134/75 140/64  Pulse: 83 91  Resp: 16 16  Temp: 98 F (36.7 C) 98.2 F (36.8 C)  SpO2: 96% 98%    Intake/Output Summary (Last 24 hours) at 03/05/2021 1055 Last data filed at 03/05/2021 0644 Gross per 24 hour  Intake 440 ml  Output 3550 ml  Net -3110 ml   Filed Weights   03/03/21 0939 03/04/21 0500 03/05/21 0500  Weight: (!) 137.3 kg 134.4 kg 123 kg   Weight change: -14.3 kg Body mass index is 38.91 kg/m.   Physical Exam: General exam: Middle-aged Caucasian male.  No acute distress. Skin: No rashes, lesions or ulcers. HEENT: Atraumatic, normocephalic, no obvious bleeding Lungs: Clear to auscultation bilaterally CVS: Regular rate and rhythm, no murmur GI/Abd soft, distended abdomen from ascites and hernia, mild tenderness, bowel sound present CNS: Alert, awake, oriented x3 Psychiatry: Depressed look Extremities: Chronic bilateral lower extremity stasis changes, poor hygiene, open wound on the left lower leg.  Both legs have Ace wraps on.  Data Review: I have personally reviewed the laboratory data and studies available.  Recent Labs  Lab 02/28/21 1634 03/01/21 5093 03/02/21 0521 03/03/21 0449 03/04/21 0413  WBC 3.5* 3.0* 3.6* 4.1 4.6  NEUTROABS  --   --  2.3 2.6 2.9  HGB 9.2* 8.2* 8.8* 9.5* 9.9*  HCT 27.2* 24.2* 26.2* 28.7* 28.9*  MCV 100.4* 100.8* 102.3* 101.4* 100.7*  PLT 70* 61* 65* 80* 81*   Recent Labs  Lab 02/28/21 1634 03/01/21 0638 03/02/21 0521 03/03/21 0449 03/04/21 0413  NA 136 138 137 136 136  K 2.8* 3.3* 3.5 2.8* 3.8  CL 106 110 108 107 108  CO2 24 24 25 24 24   GLUCOSE 99 97 94 124* 115*  BUN 13 12 11 13 12   CREATININE 0.82 0.80 0.70 0.65 0.65  CALCIUM 8.1* 7.8* 7.6* 7.7* 7.7*  MG 2.0  --  1.8  --  2.0  PHOS  --   --  2.7  --  2.2*    F/u labs ordered Unresulted Labs (From admission, onward)          Start  Ordered   03/04/21 1024  Protein, body fluid (other)  Once,   R        03/04/21 1024   Unscheduled  CBC  with Differential/Platelet  Daily,   R     Question:  Specimen collection method  Answer:  Lab=Lab collect   03/05/21 1055   Unscheduled  Basic metabolic panel  Daily,   R     Question:  Specimen collection method  Answer:  Lab=Lab collect   03/05/21 1055   Unscheduled  Magnesium  Tomorrow morning,   STAT       Question:  Specimen collection method  Answer:  Lab=Lab collect   03/05/21 1055   Unscheduled  Phosphorus  Tomorrow morning,   R       Question:  Specimen collection method  Answer:  Lab=Lab collect   03/05/21 1055          Signed, Terrilee Croak, MD [facility] 03/05/2021

## 2021-03-05 NOTE — Progress Notes (Addendum)
Hematology/Oncology Progress Note [facility] [phone] [phone]  Patient Care Team: Pcp, No as PCP - General   Name of the patient: Carl Rivas  [id number]  [birth year]  Date of visit: 03/05/21   INTERVAL HISTORY-  Patient had MRI abdomen done during interval.  Status post paracentesis and removed 5 L of peritoneal fluid. No acute overnight events.  Feels weak.  Review of systems- Review of Systems  Constitutional: Positive for fatigue. Negative for appetite change, chills, fever and unexpected weight change.  HENT:   Negative for hearing loss and voice change.   Eyes: Negative for eye problems and icterus.  Respiratory: Positive for shortness of breath. Negative for chest tightness and cough.   Cardiovascular: Negative for chest pain and leg swelling.  Gastrointestinal: Negative for abdominal pain.  Endocrine: Negative for hot flashes.  Genitourinary: Negative for difficulty urinating, dysuria and frequency.   Musculoskeletal: Negative for arthralgias.  Skin: Negative for itching and rash.  Neurological: Negative for light-headedness and numbness.  Hematological: Negative for adenopathy. Does not bruise/bleed easily.  Psychiatric/Behavioral: Negative for confusion.    Allergies  Allergen Reactions  . Penicillins Hives    Patient Active Problem List   Diagnosis Date Noted  . Swelling   . Lung mass   . Liver mass   . Complicated UTI (urinary tract infection) 03/01/2021  . Hypokalemia 03/01/2021  . Anemia 03/01/2021  . Blood in stool 03/01/2021  . Lymphedema of both lower extremities 03/01/2021  . Pressure injury of skin 03/01/2021  . Folate deficiency 04/03/2020  . Hepatitis C virus infection cured after antiviral drug therapy 11/22/2018  . Essential hypertension 08/17/2018  . Prolonged Q-T interval on ECG 08/17/2018  . Secondary thrombocytopenia 08/17/2018  . Opiate abuse, episodic ([place]) 08/13/2012  . Cirrhosis  of liver ([place]) 12/12/2011     Past Medical History:  Diagnosis Date  . Cirrhosis ([place])   . Hepatitis C virus infection cured after antiviral drug therapy       Social History   Socioeconomic History  . Marital status: Single    Spouse name: Not on file  . Number of children: Not on file  . Years of education: Not on file  . Highest education level: Not on file  Occupational History  . Not on file  Tobacco Use  . Smoking status: Former Research scientist (life sciences)  . Smokeless tobacco: Never Used  Substance and Sexual Activity  . Alcohol use: Not Currently  . Drug use: Not Currently  . Sexual activity: Not on file  Other Topics Concern  . Not on file  Social History Narrative  . Not on file   Social Determinants of Health   Financial Resource Strain: Not on file  Food Insecurity: Not on file  Transportation Needs: Not on file  Physical Activity: Not on file  Stress: Not on file  Social Connections: Not on file  Intimate Partner Violence: Not on file     Family History  Problem Relation Age of Onset  . Colon polyps Mother   . Cancer Neg Hx      Current Facility-Administered Medications:  .  albumin human 25 % solution 25 g, 25 g, Intravenous, UD, Dahal, Binaya, MD .  cephALEXin (KEFLEX) capsule 500 mg, 500 mg, Oral, Q12H, Dahal, Binaya, MD, 500 mg at 03/04/21 2057 .  furosemide (LASIX) injection 40 mg, 40 mg, Intravenous, Daily, Dahal, Binaya, MD, 40 mg at 03/04/21 1126 .  Gerhardt's butt cream, , Topical, TID, Dahal, Marlowe Aschoff, MD,  Given at 03/04/21 2057 .  oxyCODONE-acetaminophen (PERCOCET/ROXICET) 5-325 MG per tablet 1 tablet, 1 tablet, Oral, Q6H PRN, Terrilee Croak, MD, 1 tablet at 03/05/21 0021 .  sodium chloride (OCEAN) 0.65 % nasal spray 1 spray, 1 spray, Each Nare, PRN, Dahal, Binaya, MD .  sodium chloride flush (NS) 0.9 % injection 3 mL, 3 mL, Intravenous, Q12H, Marcelyn Bruins, MD, 3 mL at 03/04/21 2058   Physical exam:  Vitals:   03/04/21 2356 03/05/21 0420 03/05/21  0500 03/05/21 0805  BP: (!) 151/75 134/75  140/64  Pulse: 78 83  91  Resp: 20 16  16   Temp: 98.3 F (36.8 C) 98 F (36.7 C)  98.2 F (36.8 C)  TempSrc: Oral Oral    SpO2: 97% 96%  98%  Weight:   271 lb 2.7 oz (123 kg)   Height:       Physical Exam Constitutional:      General: He is not in acute distress.    Appearance: He is obese. He is not ill-appearing.  HENT:     Head: Normocephalic and atraumatic.     Nose: Nose normal.     Mouth/Throat:     Pharynx: No oropharyngeal exudate.  Eyes:     Pupils: Pupils are equal, round, and reactive to light.  Cardiovascular:     Rate and Rhythm: Normal rate and regular rhythm.     Heart sounds: No murmur heard.   Pulmonary:     Effort: Pulmonary effort is normal. No respiratory distress.     Breath sounds: No rales.  Chest:     Chest wall: No tenderness.  Abdominal:     General: There is distension.     Palpations: Abdomen is soft.     Tenderness: There is no abdominal tenderness.  Musculoskeletal:        General: Swelling present. Normal range of motion.     Cervical back: Normal range of motion and neck supple.  Skin:    General: Skin is warm and dry.     Findings: No erythema.  Neurological:     Mental Status: He is alert and oriented to person, place, and time. Mental status is at baseline.     Motor: No abnormal muscle tone.     Coordination: Coordination normal.  Psychiatric:        Mood and Affect: Affect normal.        CMP Latest Ref Rng & Units 03/04/2021  Glucose 70 - 99 mg/dL 115(H)  BUN 8 - 23 mg/dL 12  Creatinine 0.61 - 1.24 mg/dL 0.65  Sodium 135 - 145 mmol/L 136  Potassium 3.5 - 5.1 mmol/L 3.8  Chloride 98 - 111 mmol/L 108  CO2 22 - 32 mmol/L 24  Calcium 8.9 - 10.3 mg/dL 7.7(L)  Total Protein 6.5 - 8.1 g/dL 6.4(L)  Total Bilirubin 0.3 - 1.2 mg/dL 3.2(H)  Alkaline Phos 38 - 126 U/L 105  AST 15 - 41 U/L 122(H)  ALT 0 - 44 U/L 21   CBC Latest Ref Rng & Units 03/04/2021  WBC 4.0 - 10.5 K/uL 4.6   Hemoglobin 13.0 - 17.0 g/dL 9.9(L)  Hematocrit 39.0 - 52.0 % 28.9(L)  Platelets 150 - 400 K/uL 81(L)    RADIOGRAPHIC STUDIES: I have personally reviewed the radiological images as listed and agreed with the findings in the report. CT ABDOMEN WO CONTRAST  Result Date: 03/01/2021 CLINICAL DATA:  Hepatic cirrhosis.  Anemia. EXAM: CT CHEST, ABDOMEN AND PELVIS WITHOUT CONTRAST TECHNIQUE: Multidetector  CT imaging of the chest and abdomen was performed following the standard protocol without IV contrast. COMPARISON:  None. FINDINGS: CT CHEST FINDINGS Cardiovascular: Atherosclerosis of thoracic aorta is noted without aneurysm formation. Coronary calcifications are noted. Normal cardiac size. No pericardial effusion. Mediastinum/Nodes: No enlarged mediastinal, hilar, or axillary lymph nodes. Thyroid gland, trachea, and esophagus demonstrate no significant findings. Lungs/Pleura: No pneumothorax or pleural effusion is noted. Multiple large nodules are noted in the right lower lobe concerning for malignancy and metastatic disease. Largest nodule measures 2.7 x 2.2 cm. 7 mm nodule is noted in left upper lobe best seen on image number 35 of series 4. Musculoskeletal: No chest wall mass or suspicious bone lesions identified. CT ABDOMEN FINDINGS Hepatobiliary: Nodular hepatic contours are noted consistent with hepatic cirrhosis. Moderate amount of ascites is noted around the liver. Cholelithiasis is noted. No biliary dilatation is noted. Ill-defined low densities are noted throughout the hepatic parenchyma suggesting possible malignancy or metastatic disease. Pancreas: Unremarkable. No pancreatic ductal dilatation or surrounding inflammatory changes. Spleen: Moderate splenomegaly is noted. Adrenals/Urinary Tract: Adrenal glands and kidneys are unremarkable. Stomach/Bowel: The stomach and visualized bowel loops are unremarkable. Vascular/Lymphatic: Atherosclerosis of abdominal aorta is noted without aneurysm formation.  Collateral veins are noted suggesting portal hypertension. No definite significant adenopathy is noted. Other: Moderate ascites is noted around the liver and spleen. Large periumbilical hernia is noted. Musculoskeletal: No acute or significant osseous findings. IMPRESSION: Multiple large nodules are noted in the right lower lobe consistent with metastatic disease or primary malignancy. Findings consistent with hepatic cirrhosis. There appears to be multiple ill-defined low densities in the hepatic parenchyma suggesting malignancy or metastatic disease. Further evaluation with MRI is recommended. Moderate splenomegaly is noted with collateral veins present suggesting portal hypertension. Moderate ascites is noted around the liver and spleen. Large periumbilical hernia is noted. Coronary artery calcifications are noted suggesting coronary artery disease. Aortic Atherosclerosis (ICD10-I70.0). Electronically Signed   By: Marijo Conception [name]   On: 03/01/2021 17:25   CT CHEST WO CONTRAST  Result Date: 03/01/2021 CLINICAL DATA:  Hepatic cirrhosis.  Anemia. EXAM: CT CHEST, ABDOMEN AND PELVIS WITHOUT CONTRAST TECHNIQUE: Multidetector CT imaging of the chest and abdomen was performed following the standard protocol without IV contrast. COMPARISON:  None. FINDINGS: CT CHEST FINDINGS Cardiovascular: Atherosclerosis of thoracic aorta is noted without aneurysm formation. Coronary calcifications are noted. Normal cardiac size. No pericardial effusion. Mediastinum/Nodes: No enlarged mediastinal, hilar, or axillary lymph nodes. Thyroid gland, trachea, and esophagus demonstrate no significant findings. Lungs/Pleura: No pneumothorax or pleural effusion is noted. Multiple large nodules are noted in the right lower lobe concerning for malignancy and metastatic disease. Largest nodule measures 2.7 x 2.2 cm. 7 mm nodule is noted in left upper lobe best seen on image number 35 of series 4. Musculoskeletal: No chest wall mass or  suspicious bone lesions identified. CT ABDOMEN FINDINGS Hepatobiliary: Nodular hepatic contours are noted consistent with hepatic cirrhosis. Moderate amount of ascites is noted around the liver. Cholelithiasis is noted. No biliary dilatation is noted. Ill-defined low densities are noted throughout the hepatic parenchyma suggesting possible malignancy or metastatic disease. Pancreas: Unremarkable. No pancreatic ductal dilatation or surrounding inflammatory changes. Spleen: Moderate splenomegaly is noted. Adrenals/Urinary Tract: Adrenal glands and kidneys are unremarkable. Stomach/Bowel: The stomach and visualized bowel loops are unremarkable. Vascular/Lymphatic: Atherosclerosis of abdominal aorta is noted without aneurysm formation. Collateral veins are noted suggesting portal hypertension. No definite significant adenopathy is noted. Other: Moderate ascites is noted around the liver and spleen. Large  periumbilical hernia is noted. Musculoskeletal: No acute or significant osseous findings. IMPRESSION: Multiple large nodules are noted in the right lower lobe consistent with metastatic disease or primary malignancy. Findings consistent with hepatic cirrhosis. There appears to be multiple ill-defined low densities in the hepatic parenchyma suggesting malignancy or metastatic disease. Further evaluation with MRI is recommended. Moderate splenomegaly is noted with collateral veins present suggesting portal hypertension. Moderate ascites is noted around the liver and spleen. Large periumbilical hernia is noted. Coronary artery calcifications are noted suggesting coronary artery disease. Aortic Atherosclerosis (ICD10-I70.0). Electronically Signed   By: Marijo Conception [name]   On: 03/01/2021 17:25   MR ABDOMEN W WO CONTRAST  Result Date: 03/03/2021 CLINICAL DATA:  Hepatic cirrhosis, suspected malignancy. Question of mass seen on CT imaging of the chest. EXAM: MRI ABDOMEN WITHOUT AND WITH CONTRAST TECHNIQUE: Multiplanar  multisequence MR imaging of the abdomen was performed both before and after the administration of intravenous contrast. CONTRAST:  19mL GADAVIST GADOBUTROL 1 MMOL/ML IV SOLN COMPARISON:  CT of the chest and CT of the abdomen from Mar 01, 2021. FINDINGS: Lower chest: Nodules and masses at the RIGHT lung base better seen on recent chest CT. Hepatobiliary: Marked hepatic cirrhosis. Expansile portal venous invasion in diffuse signal abnormality in the central liver. Areas lack frank arterial phase enhancement, unclear whether this could be an issue of bolus timing based on images presented. Signal is present in vascular structures and there is apparent enhancement of viscera on the first of the T1 weighted images. Diffusion weighted abnormality measuring as much as 16 by 9 cm in the central liver tracking through areas of both LEFT and RIGHT hepatic lobe as well as the caudate. Expansile portal venous thrombus with slight increase in signal intensity from 113 signal intensity units to approximately 150 signal intensity units over the course of the study. Discrete mass from which this process arises is not visualized. More hypointense area seen on image 23 of series 11 in the posterior RIGHT liver with characteristics of washout measuring 10 x 6 cm and contiguous with the caudate lobe and other areas of branching low signal in the portal venous system is compatible more likely with the primary tumor. Smaller satellite lesions are present as well, for instance in the anterior LEFT hepatic lobe (image 21/11) 13 mm area. Main portal vein with increased signal intensity from pre to postcontrast as well. The SMV shows flow based on enhancement into the splenic portal confluence. There are large collateral pathways in the upper abdomen with gastric and esophageal varices. Large lymph node at the celiac axis measuring up to 2.5 cm also with restricted diffusion. Pancreas:  No focal pancreatic abnormality Spleen:  Splenomegaly.   No focal lesion. Adrenals/Urinary Tract: Adrenal glands are normal. Symmetric renal enhancement. Small cysts in the bilateral kidneys. Stomach/Bowel: Generalized bowel edema in the setting of ascites and portal hypertension. No acute findings to the extent evaluated on abdominal MRI. 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 Necrotic appearing celiac node. Other:  Large volume ascites. Musculoskeletal: No suspicious bone lesions identified. IMPRESSION: 1. Infiltrative mass in the liver with extensive tumor thrombus involving all branches of the portal vein, extending from the main portal vein into RIGHT and LEFT portal venous branches. Likely hepatocellular carcinoma with portal venous invasion, nodal and pulmonary metastatic disease. Correlation with alpha fetoprotein levels is suggested. 2. Associated with large collateral pathways in the upper abdomen and large volume ascites. Electronically Signed   By: Zetta Bills  [name]   On: 03/03/2021 10:25   US Paracentesis  Result Date: 03/04/2021 INDICATION: Patient with history of alcoholism, cured hepatitis C, liver cirrhosis. Presented to the ED with lower extremity edema and generalized weakness. Found to have ascites. Request is for therapeutic and diagnostic paracentesis EXAM: ULTRASOUND GUIDED THERAPEUTIC AND DIAGNOSTIC PARACENTESIS MEDICATIONS: Lidocaine 1% 10 mL COMPLICATIONS: None immediate. PROCEDURE: Informed written consent was obtained from the patient after a discussion of the risks, benefits and alternatives to treatment. A timeout was performed prior to the initiation of the procedure. Initial ultrasound scanning demonstrates a moderate amount of ascites within the right lower abdominal quadrant. The right lower abdomen was prepped and draped in the usual sterile fashion. 1% lidocaine was used for local anesthesia. Following this, a 19 gauge, 10-cm, Yueh catheter was introduced. An ultrasound image was saved for documentation  purposes. The paracentesis was performed. The catheter was removed and a dressing was applied. The patient tolerated the procedure well without immediate post procedural complication. Patient received post-procedure intravenous albumin; see nursing notes for details. FINDINGS: A total of approximately 5 L of straw-colored fluid was removed. Samples were sent to the laboratory as requested by the clinical team. IMPRESSION: Successful ultrasound-guided therapeutic and diagnostic paracentesis yielding 5 liters of peritoneal fluid. Read by: Rushie Nyhan, NP Electronically Signed   By: Markus Daft [name]   On: 03/04/2021 11:20   US Abdomen Limited RUQ (LIVER/GB)  Addendum Date: 03/01/2021   ADDENDUM REPORT: 03/01/2021 14:14 ADDENDUM: These results were called by telephone at the time of interpretation on 03/01/2021 at 2:14 pm to provider [facility] , who verbally acknowledged these results. Electronically Signed   By: Zetta Bills [name]   On: 03/01/2021 14:14   Result Date: 03/01/2021 CLINICAL DATA:  Cirrhosis, anasarca. EXAM: ULTRASOUND ABDOMEN LIMITED RIGHT UPPER QUADRANT COMPARISON:  Abdominal sonogram from 2007. FINDINGS: Gallbladder: Marked gallbladder wall thickening in the presence of ascites and marked liver disease. No reported tenderness over the gallbladder. Gallbladder wall thickness in excess of 7 mm. Common bile duct: Diameter: 6 mm Liver: Markedly limited assessment of hepatic parenchyma. Poor sonographic window and diffuse edema in the setting of ascites limits assessment. Subtle area of nodularity along the margin of the liver on image 18 is hypoechoic relative to adjacent liver raising the question of a focal liver lesion measuring 2.8 x 2.6 cm. Liver is overall extremely heterogeneous. No visible flow in the portal vein both on color and power Doppler imaging. Other: Moderate volume ascites. IMPRESSION: Marked hepatic cirrhosis with findings that are most suggestive of portal venous  occlusion without flow visible on color or power Doppler imaging. Potential focal hepatic lesion in the setting of cirrhosis. Findings could be seen in the setting of hepatocellular carcinoma. Exam limited by body habitus and poor sonographic window. For above findings would suggest multiphase CT with liver protocol for further evaluation, to guide further management. Ascites and anasarca a with gallbladder wall thickening likely related to liver disease and portal hypertension. A call is out to the referring provider to further discuss findings in the above case. Electronically Signed: By: Zetta Bills [name] On: 03/01/2021 14:10    Assessment and plan-   #Multiple liver mass in cirrhotic liver, multiple lung nodules Suspect HCC.    AFP is elevated at 345. MRI abdomen with and without contrast showed infiltrative mass in the liver with extensive tumor thrombus involving all branches of the portal vein, extending from the main portal vein into right and the left  portal vein branches.  Likely hepatocellular carcinoma with portal vein invasion, nodal and pulmonary metastasis.  Large collateral pathways in the upper abdomen and a large volume of ascites I discussed with radiology Dr. Jacalyn Lefevre and he will addendum the report using LI-Rad scoring system.  Per Dr. Jacalyn Lefevre, it is difficult to assign LI Rad score, as there is no discrete mass.  Ascites, status post paracentesis with 5 L removal.  Cytology is pending.  Positive cytology may help with histology diagnosis. If cytology is negative, the question is if patient would like to proceed with biopsy for confirmation of Bailey diagnosis.   Liver cirrhosis, albumin 2, total bilirubin 3.8.    INR 1.5. Child Pugh C liver cirrhosis.  Assuming this is [place], I would recommend comfort care/hospice. Cancer treatment for other cancer histologies can be challenging as well given his impaired liver function, poor performance status. Patient would like to further discuss with  his sister and brother. Consult palliative care service.  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  Given unknown varices condition, thrombocytopenia, risk of anticoagulation overweighs the benefit.  I will hold off therapeutic anticoagulation at this point. Discussed with hospitalist Dr.Dahal  Thank you for allowing me to participate in the care of this patient.   Earlie Server, MD, PhD Hematology Oncology [facility] Pager- [phone] 03/05/2021

## 2021-03-05 NOTE — Progress Notes (Signed)
Physical Therapy Treatment Patient Details Name: Carl Rivas MRN: [id number] DOB: 1956/08/18 Today's Date: 03/05/2021    History of Present Illness Carl Rivas is a 65 y.o. male with PMH significant for morbid obesity, history of alcoholism, opioid use, cured hepatitis C, liver cirrhosis, thrombocytopenia, chronic anemia, hypertension, prolonged QT, lymphedema.  Patient lives at home alone, his brother lives close by.  Patient presented to the ED on 5/19 with ongoing generalized weakness and lower extremity edema, ongoing for a month.  Also reports intermittent shortness of breath as well as leg cramping.    PT Comments    Pt resting in bed upon PT arrival; agreeable to PT session.  Pt reporting mild abdominal pain during session (pt received pain medication prior to PT session).  Pt declined LE ex's in bed but agreeable to OOB mobility.  Modified independent with bed mobility; CGA with transfers; and CGA with ambulation 40 feet with RW (limited distance d/t SOB and fatigue).  Will continue to monitor pt's status and progress mobility per pt's goals.    Follow Up Recommendations  Home health PT     Equipment Recommendations  Rolling walker with 5" wheels    Recommendations for Other Services       Precautions / Restrictions Precautions Precautions: Fall Restrictions Weight Bearing Restrictions: No    Mobility  Bed Mobility Overal bed mobility: Modified Independent Bed Mobility: Supine to Sit;Sit to Supine     Supine to sit: Modified independent (Device/Increase time);HOB elevated Sit to supine: Modified independent (Device/Increase time);HOB elevated   General bed mobility comments: mild increased effort to perform on own    Transfers Overall transfer level: Needs assistance Equipment used: Rolling walker (2 wheeled) Transfers: Sit to/from Stand Sit to Stand: Min guard         General transfer comment: x1 trial from bed and x1 trial from recliner; mild  increased effort (and use of some momentum) to stand  Ambulation/Gait Ambulation/Gait assistance: Min guard Gait Distance (Feet): 40 Feet Assistive device: Rolling walker (2 wheeled) Gait Pattern/deviations: Step-through pattern Gait velocity: decreased   General Gait Details: steady ambulation with RW   Stairs             Wheelchair Mobility    Modified Rankin (Stroke Patients Only)       Balance Overall balance assessment: Needs assistance Sitting-balance support: No upper extremity supported;Feet supported Sitting balance-Leahy Scale: Normal Sitting balance - Comments: steady sitting reaching outside BOS   Standing balance support: Single extremity supported Standing balance-Leahy Scale: Fair Standing balance comment: pt requiring at least single UE support for static standing balance                            Cognition Arousal/Alertness: Awake/alert Behavior During Therapy: WFL for tasks assessed/performed Overall Cognitive Status: Within Functional Limits for tasks assessed                                        Exercises      General Comments   Nursing cleared pt for participation in physical therapy.  Pt agreeable to PT session.      Pertinent Vitals/Pain Pain Assessment: Faces Faces Pain Scale: Hurts a little bit Pain Location: abdomen Pain Descriptors / Indicators: Discomfort Pain Intervention(s): Limited activity within patient's tolerance;Monitored during session;Repositioned;Premedicated before session  O2 on room air stable  and WFL throughout treatment session. HR 100 bpm at rest but increased up to 122 bpm with activity.    Home Living                      Prior Function            PT Goals (current goals can now be found in the care plan section) Acute Rehab PT Goals Patient Stated Goal: to feel better PT Goal Formulation: With patient/family Time For Goal Achievement: 03/16/21 Potential to  Achieve Goals: Good Progress towards PT goals: Progressing toward goals    Frequency    Min 2X/week      PT Plan Current plan remains appropriate    Co-evaluation              AM-PAC PT "6 Clicks" Mobility   Outcome Measure  Help needed turning from your back to your side while in a flat bed without using bedrails?: None Help needed moving from lying on your back to sitting on the side of a flat bed without using bedrails?: None Help needed moving to and from a bed to a chair (including a wheelchair)?: A Little Help needed standing up from a chair using your arms (e.g., wheelchair or bedside chair)?: A Little Help needed to walk in hospital room?: A Little Help needed climbing 3-5 steps with a railing? : A Little 6 Click Score: 20    End of Session Equipment Utilized During Treatment: Gait belt Activity Tolerance: Patient limited by fatigue Patient left: in bed;with call bell/phone within reach;with bed alarm set Nurse Communication: Mobility status;Precautions PT Visit Diagnosis: Other abnormalities of gait and mobility (R26.89);Muscle weakness (generalized) (M62.81);Difficulty in walking, not elsewhere classified (R26.2)     Time: 1005-1020 PT Time Calculation (min) (ACUTE ONLY): 15 min  Charges:  $Therapeutic Activity: 8-22 mins                    Leitha Bleak, PT 03/05/21, 10:37 AM

## 2021-03-05 NOTE — Progress Notes (Signed)
Tumor b

## 2021-03-06 LAB — BASIC METABOLIC PANEL
Anion gap: 7 (ref 5–15)
BUN: 18 mg/dL (ref 8–23)
CO2: 26 mmol/L (ref 22–32)
Calcium: 8 mg/dL — ABNORMAL LOW (ref 8.9–10.3)
Chloride: 101 mmol/L (ref 98–111)
Creatinine, Ser: 0.68 mg/dL (ref 0.61–1.24)
GFR, Estimated: >60 mL/min (ref >60)
Glucose, Bld: 95 mg/dL (ref 70–99)
Potassium: 3.8 mmol/L (ref 3.5–5.1)
Sodium: 134 mmol/L — ABNORMAL LOW (ref 135–145)

## 2021-03-06 LAB — PHOSPHORUS: Phosphorus: 2.8 mg/dL (ref 2.5–4.6)

## 2021-03-06 LAB — CBC WITH DIFFERENTIAL/PLATELET
Abs Immature Granulocytes: 0.03 10*3/uL (ref 0.00–0.07)
Basophils Absolute: 0.1 10*3/uL (ref 0.0–0.1)
Basophils Relative: 1 %
Eosinophils Absolute: 0.1 10*3/uL (ref 0.0–0.5)
Eosinophils Relative: 3 %
HCT: 31.3 % — ABNORMAL LOW (ref 39.0–52.0)
Hemoglobin: 10.4 g/dL — ABNORMAL LOW (ref 13.0–17.0)
Immature Granulocytes: 1 %
Lymphocytes Relative: 19 %
Lymphs Abs: 1.1 10*3/uL (ref 0.7–4.0)
MCH: 33.8 pg (ref 26.0–34.0)
MCHC: 33.2 g/dL (ref 30.0–36.0)
MCV: 101.6 fL — ABNORMAL HIGH (ref 80.0–100.0)
Monocytes Absolute: 0.6 10*3/uL (ref 0.1–1.0)
Monocytes Relative: 11 %
Neutro Abs: 3.7 10*3/uL (ref 1.7–7.7)
Neutrophils Relative %: 65 %
Platelets: 80 10*3/uL — ABNORMAL LOW (ref 150–400)
RBC: 3.08 MIL/uL — ABNORMAL LOW (ref 4.22–5.81)
RDW: 15.7 % — ABNORMAL HIGH (ref 11.5–15.5)
WBC: 5.6 10*3/uL (ref 4.0–10.5)
nRBC: 0 % (ref 0.0–0.2)

## 2021-03-06 LAB — MAGNESIUM: Magnesium: 2 mg/dL (ref 1.7–2.4)

## 2021-03-06 NOTE — Progress Notes (Signed)
Progress Note    Carl Rivas   YQM:[id number]  DOB: 02/28/1956  DOA: 02/28/2021     5  PCP: Pcp, No  CC: weakness  Hospital Course: Carl Rivas is a 65 y.o. male with PMH significant formorbid obesity, history of alcoholism, opioid use, cured hepatitis C, liver cirrhosis, thrombocytopenia, chronic anemia, hypertension, prolonged QT, lymphedema.  Patient lives at home alone, his brother lives close by. Patient presented to the ED on 5/19 with ongoing generalized weakness and lower extremity edema, ongoing for a month.  Also reports intermittent shortness of breath as well as leg cramping.  In the ED, hemodynamically stable, breathing on room air Potassium low at 2.8, hemoglobin low at 9.2, platelet low but stable at 70 Urinalysis significant for nitrites, leukocytes, bacteria, hemoglobin.  Admitted to hospitalist service for further evaluation management  5/20, CT chest, abdomen and pelvis with significant findings as below 1. Multiple large nodules are noted in the right lower lobe consistent with metastatic disease or primary malignancy. 2. Multiple ill-defined low densities in the hepatic parenchyma suggesting malignancy or metastatic disease. 3. Liver cirrhosis, portal hypertension, moderate ascites 4. Large periumbilical hernia is noted.  5/21, MRI abdomen was obtained which showed 1. Infiltrative mass in the liver with extensive tumor thrombus involving all branches of the portal vein, extending from the main portal vein into RIGHT and LEFT portal venous branches. Likely hepatocellular carcinoma with portal venous invasion, nodal and pulmonary metastatic disease.  2. Associated with large collateral pathways in the upper abdomen and large volume ascites.  Interval History:  Cytology was negative for malignant cells from the paracentesis.  Patient now is wishing to pursue diagnosis prior to making further decisions about foregoing treatment if consistent with  HCC.  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 -Oncology consult obtained with Dr. Tasia Catchings.  cytology of ascitic fluid negative for malignant cells.   -Patient wishing for further work-up to pursue diagnosis.  Will discuss with oncology about recommended imaging study needed versus biopsy - patient seems to still be okay with pursuing hospice if this is confirmed Carl Rivas with mets - palliative care following as well  Portal vein thrombosis -With MRI findings as above, portal vein invasion by hepatocellular carcinoma with extensive tumor thrombus involving all branches of the liver. - not an anticoagulation candidate per oncology  Liver cirrhosis/portal hypertension/moderate ascites -Liver cirrhosis secondary to h/o alcoholism, IV drugs and hepatitis C (cured).  Patient reports he is no longer using alcohol or drugs.  -Not on any medicines at home.  Does not follow with GI as an outpatient -5/23 underwent paracentesis, 5 L of straw-colored ascitic fluid drained.   -Currently has compression bandage on both lower extremities. -5/21, started on Lasix IV 40 mg daily.  Continue the same. -Continue to monitor blood pressure, electrolytes and renal function  E. coli UTI -Urinalysis significant for nitrites, leukocytes, bacteria -Urine culture with more than 100,000 CFU per mL of E. Coli, pansensitive. -Completed Rocephin course during hospitalization  Hypokalemia/hypophosphatemia -Due to poor nutrition as well as  IV Lasix.  Replacement ordered  Intermittent  bloody stool - FOBT positive Chronic anemia History of folic acid deficiency -Reports intermittent dark stool in the setting of liver cirrhosis.  -Baseline hemoglobin was 12 a year ago, presented with hemoglobin low at 9.2.  Despite FOBT positive, hemoglobin remains stable so far.  Thrombocytopenia -Running low.  Likely related to liver cirrhosis no active bleeding.  Continue to monitor  Prolonged QTC -QTC is prolonged in the setting of hypokalemia.  Continue to monitor potassium level. -Avoid QT prolonging medications  Hypoalbuminemia -Albumin level low at 2.1, probably related to liver cirrhosis.  Chronic incarcerated abdominal hernia -He was recently at [facility] for chronic abdominal pain and evaluation which included imaging showing a chronic incarcerated hernia.  He was evaluated by general surgery which indicated no need for urgent intervention. Now likely not a candidate for surgery if needed but remains stable  Chronic wounds- POA -Patient has chronic cellulitis, poor hygiene of bilateral lower extremities.  There is an open wound on the left lower leg and back of his thigh.  Discussed with wound care nurse.  No need of surgical debridement. Local wound care recommended.  Goals of care -Poor prognosis because of advanced liver cirrhosis and a new diagnosis of suspected hepatocellular carcinoma.  Hospice service recommended.  Palliative care consult obtained.  Patient and family agreed to DNR status at this time.  They are discussing about the hospice recommendation. - patient wishes to pursue definitive diagnosis   Old records reviewed in assessment of this patient   DVT prophylaxis: SCDs Start: 03/01/21 0002   Code Status:   Code Status: DNR Family Communication:   Disposition Plan: Status is: Inpatient  Remains inpatient appropriate because:Ongoing diagnostic testing needed not appropriate for outpatient work up, IV  treatments appropriate due to intensity of illness or inability to take PO and Inpatient level of care appropriate due to severity of illness   Dispo: The patient is from: Home              Anticipated d/c is to: Home              Patient currently is not medically stable to d/c.   Difficult to place patient No  Risk of unplanned readmission score: Unplanned Admission- Pilot do not use: 13.63   Objective: Blood pressure 138/71, pulse 97, temperature 98.2 F (36.8 C), temperature source Oral, resp. rate 16, height 5\' 10"  (1.778 m), weight 121.7 kg, SpO2 95 %.  Examination: General appearance: alert, cooperative and no distress Head: Normocephalic, without obvious abnormality, atraumatic Eyes: EOMI Lungs: clear to auscultation bilaterally Heart: regular rate and rhythm and S1, S2 normal Abdomen: Obese, lower middle hernia appreciated with mild firmness.  Tenderness to palpation noted in right upper quadrant.  Bowel sounds present Extremities: 2+ bilateral lower extremity pitting edema Skin: Chronic stasis changes noted in bilateral lower extremities Neurologic: Grossly normal  Consultants:   Oncology  Palliative care medicine  Procedures:     Data Reviewed: I have personally reviewed following labs and imaging studies Results for orders placed or performed during the hospital encounter of 02/28/21 (from the past 24 hour(s))  CBC with Differential/Platelet     Status: Abnormal   Collection Time: 03/06/21  5:24 AM  Result Value Ref Range   WBC 5.6 4.0 - 10.5 K/uL   RBC 3.08 (L) 4.22 - 5.81 MIL/uL   Hemoglobin 10.4 (L) 13.0 - 17.0 g/dL   HCT 31.3 (L) 39.0 - 52.0 %   MCV 101.6 (H) 80.0 - 100.0 fL  MCH 33.8 26.0 - 34.0 pg   MCHC 33.2 30.0 - 36.0 g/dL   RDW 15.7 (H) 11.5 - 15.5 %   Platelets 80 (L) 150 - 400 K/uL   nRBC 0.0 0.0 - 0.2 %   Neutrophils Relative % 65 %   Neutro Abs 3.7 1.7 - 7.7 K/uL   Lymphocytes Relative 19 %   Lymphs Abs 1.1 0.7 - 4.0 K/uL   Monocytes  Relative 11 %   Monocytes Absolute 0.6 0.1 - 1.0 K/uL   Eosinophils Relative 3 %   Eosinophils Absolute 0.1 0.0 - 0.5 K/uL   Basophils Relative 1 %   Basophils Absolute 0.1 0.0 - 0.1 K/uL   Immature Granulocytes 1 %   Abs Immature Granulocytes 0.03 0.00 - 0.07 K/uL  Basic metabolic panel     Status: Abnormal   Collection Time: 03/06/21  5:24 AM  Result Value Ref Range   Sodium 134 (L) 135 - 145 mmol/L   Potassium 3.8 3.5 - 5.1 mmol/L   Chloride 101 98 - 111 mmol/L   CO2 26 22 - 32 mmol/L   Glucose, Bld 95 70 - 99 mg/dL   BUN 18 8 - 23 mg/dL   Creatinine, Ser 0.68 0.61 - 1.24 mg/dL   Calcium 8.0 (L) 8.9 - 10.3 mg/dL   GFR, Estimated >60 >60 mL/min   Anion gap 7 5 - 15  Magnesium     Status: None   Collection Time: 03/06/21  5:24 AM  Result Value Ref Range   Magnesium 2.0 1.7 - 2.4 mg/dL  Phosphorus     Status: None   Collection Time: 03/06/21  5:24 AM  Result Value Ref Range   Phosphorus 2.8 2.5 - 4.6 mg/dL    Recent Results (from the past 240 hour(s))  Urine culture     Status: Abnormal   Collection Time: 02/28/21  4:34 PM   Specimen: Urine, Random  Result Value Ref Range Status   Specimen Description   Final    URINE, RANDOM Performed at [facility], [address] [place], [place] [address]    Special Requests   Final    NONE Performed at [facility], [address] [place],  [address]    Culture >=100,000 COLONIES/mL ESCHERICHIA COLI (A)  Final   Report Status 03/03/2021 FINAL  Final   Organism ID, Bacteria ESCHERICHIA COLI (A)  Final      Susceptibility   Escherichia coli - MIC*    AMPICILLIN <=2 SENSITIVE Sensitive     CEFAZOLIN <=4 SENSITIVE Sensitive     CEFEPIME <=0.12 SENSITIVE Sensitive     CEFTRIAXONE <=0.25 SENSITIVE Sensitive     CIPROFLOXACIN <=0.25 SENSITIVE Sensitive     GENTAMICIN <=1 SENSITIVE Sensitive     IMIPENEM <=0.25 SENSITIVE Sensitive     NITROFURANTOIN <=16 SENSITIVE Sensitive     TRIMETH/SULFA <=20  SENSITIVE Sensitive     AMPICILLIN/SULBACTAM <=2 SENSITIVE Sensitive     PIP/TAZO <=4 SENSITIVE Sensitive     * >=100,000 COLONIES/mL ESCHERICHIA COLI  Resp Panel by RT-PCR (Flu A&B, Covid) Nasopharyngeal Swab     Status: None   Collection Time: 02/28/21 11:52 PM   Specimen: Nasopharyngeal Swab; Nasopharyngeal(NP) swabs in vial transport medium  Result Value Ref Range Status   SARS Coronavirus 2 by RT PCR NEGATIVE NEGATIVE Final    Comment: (NOTE) SARS-CoV-2 target nucleic acids are NOT DETECTED.  The SARS-CoV-2 RNA is generally detectable in upper respiratory specimens during the acute phase of  infection. The lowest concentration of SARS-CoV-2 viral copies this assay can detect is 138 copies/mL. A negative result does not preclude SARS-Cov-2 infection and should not be used as the sole basis for treatment or other patient management decisions. A negative result may occur with  improper specimen collection/handling, submission of specimen other than nasopharyngeal swab, presence of viral mutation(s) within the areas targeted by this assay, and inadequate number of viral copies(<138 copies/mL). A negative result must be combined with clinical observations, patient history, and epidemiological information. The expected result is Negative.  Fact Sheet for Patients:  EntrepreneurPulse.com.au  Fact Sheet for Healthcare Providers:  IncredibleEmployment.be  This test is no t yet approved or cleared by the [place] FDA and  has been authorized for detection and/or diagnosis of SARS-CoV-2 by FDA under an Emergency Use Authorization (EUA). This EUA will remain  in effect (meaning this test can be used) for the duration of the COVID-19 declaration under Section 564(b)(1) of the Act, 21 U.S.C.section 360bbb-3(b)(1), unless the authorization is terminated  or revoked sooner.       Influenza A by PCR NEGATIVE NEGATIVE Final   Influenza B by PCR  NEGATIVE NEGATIVE Final    Comment: (NOTE) The Xpert Xpress SARS-CoV-2/FLU/RSV plus assay is intended as an aid in the diagnosis of influenza from Nasopharyngeal swab specimens and should not be used as a sole basis for treatment. Nasal washings and aspirates are unacceptable for Xpert Xpress SARS-CoV-2/FLU/RSV testing.  Fact Sheet for Patients: EntrepreneurPulse.com.au  Fact Sheet for Healthcare Providers: IncredibleEmployment.be  This test is not yet approved or cleared by the [place] FDA and has been authorized for detection and/or diagnosis of SARS-CoV-2 by FDA under an Emergency Use Authorization (EUA). This EUA will remain in effect (meaning this test can be used) for the duration of the COVID-19 declaration under Section 564(b)(1) of the Act, 21 U.S.C. section 360bbb-3(b)(1), unless the authorization is terminated or revoked.  Performed at [facility], [address] [place], [place] [address]   Body fluid culture w Gram Stain     Status: None (Preliminary result)   Collection Time: 03/04/21 10:24 AM   Specimen: PATH Cytology Peritoneal fluid  Result Value Ref Range Status   Specimen Description   Final    PERITONEAL Performed at [facility], [address], [place], [place] [address]    Special Requests   Final    NONE Performed at [facility], [address] [place], [place] [address]    Gram Stain   Final    WBC PRESENT,BOTH PMN AND MONONUCLEAR NO ORGANISMS SEEN CYTOSPIN SMEAR    Culture   Final    NO GROWTH 2 DAYS Performed at [facility], [place] [address] [place], [place] [address]    Report Status PENDING  Incomplete     Radiology Studies: No results found. US Paracentesis  Final Result    MR ABDOMEN W WO CONTRAST  Final Result  Addendum 1 of 1  ADDENDUM REPORT: 03/05/2021 21:15    ADDENDUM:  Imaging findings as reported by strict LI-RADS definition,  categorized  as "LR-TIV, probably due to hepatocellular carcinoma" .  Given the history of hepatitis C, history of liver disease as well  as elevated AFP, findings are compatible with [place] as outlined in the  initial report.      Electronically Signed    By: Zetta Bills [name]    On: 03/05/2021 21:15      Final  CT ABDOMEN WO CONTRAST  Final Result    CT CHEST WO CONTRAST  Final Result    US Abdomen Limited RUQ (LIVER/GB)  Final Result  Addendum 1 of 1  ADDENDUM REPORT: 03/01/2021 14:14    ADDENDUM:  These results were called by telephone at the time of interpretation  on 03/01/2021 at 2:14 pm to provider [facility] , who verbally  acknowledged these results.      Electronically Signed    By: Zetta Bills [name]    On: 03/01/2021 14:14      Final      Scheduled Meds: . cephALEXin  500 mg Oral Q12H  . furosemide  40 mg Intravenous Daily  . Gerhardt's butt cream   Topical TID  . sodium chloride flush  3 mL Intravenous Q12H   PRN Meds: oxyCODONE-acetaminophen, sodium chloride Continuous Infusions: . albumin human       LOS: 5 days  Time spent: Greater than 50% of the 35 minute visit was spent in counseling/coordination of care for the patient as laid out in the A&P.   Dwyane Dee, MD [facility] 03/06/2021, 5:26 PM

## 2021-03-06 NOTE — Progress Notes (Signed)
Physical Therapy Treatment Patient Details Name: Carl Rivas MRN: [id number] DOB: [birth year] Today's Date: 03/06/2021    History of Present Illness Carl Rivas is a 65 y.o. male with PMH significant for morbid obesity, history of alcoholism, opioid use, cured hepatitis C, liver cirrhosis, thrombocytopenia, chronic anemia, hypertension, prolonged QT, lymphedema.  Patient lives at home alone, his brother lives close by.  Patient presented to the ED on 5/19 with ongoing generalized weakness and lower extremity edema, ongoing for a month.  Also reports intermittent shortness of breath as well as leg cramping.    PT Comments    Pt is making gradual progress towards goals with ability to ambulate in room distances using RW. Prefers to sit at [facility] and wants some birthday cake from his birthday yesterday. Defers there-ex at this time, however educated on continuing there-ex as able. Will continue to progress as able.   Follow Up Recommendations  Home health PT     Equipment Recommendations  Rolling walker with 5" wheels    Recommendations for Other Services       Precautions / Restrictions Precautions Precautions: Fall Restrictions Weight Bearing Restrictions: No    Mobility  Bed Mobility Overal bed mobility: Needs Assistance Bed Mobility: Supine to Sit;Sit to Supine     Supine to sit: [facility] elevated;Min assist     General bed mobility comments: increased effort. Needs assist for UE support. ONce seated, able to scoot towards EOB.    Transfers Overall transfer level: Needs assistance Equipment used: Rolling walker (2 wheeled) Transfers: Sit to/from Stand Sit to Stand: Min guard         General transfer comment: cues for pushing from seated surface. Bed elevated prior to standing  Ambulation/Gait Ambulation/Gait assistance: Min guard Gait Distance (Feet): 40 Feet Assistive device: Rolling walker (2 wheeled) Gait Pattern/deviations: Step-through pattern      General Gait Details: ambulated in room with slow speed. Needs cues for balance during turns as he places RW outside of BOS. Fatigues with minimal exertion   Stairs             Wheelchair Mobility    Modified Rankin (Stroke Patients Only)       Balance Overall balance assessment: Needs assistance Sitting-balance support: No upper extremity supported;Feet supported Sitting balance-Leahy Scale: Normal     Standing balance support: Bilateral upper extremity supported Standing balance-Leahy Scale: Fair                              Cognition Arousal/Alertness: Awake/alert Behavior During Therapy: WFL for tasks assessed/performed Overall Cognitive Status: Within Functional Limits for tasks assessed                                 General Comments: pleasant and appreciative of assistance      Exercises      General Comments        Pertinent Vitals/Pain Pain Assessment: No/denies pain    Home Living                      Prior Function            PT Goals (current goals can now be found in the care plan section) Acute Rehab PT Goals Patient Stated Goal: to feel better PT Goal Formulation: With patient/family Time For Goal Achievement: 03/16/21 Potential to Achieve Goals: Good  Progress towards PT goals: Progressing toward goals    Frequency    Min 2X/week      PT Plan Current plan remains appropriate    Co-evaluation              AM-PAC PT "6 Clicks" Mobility   Outcome Measure  Help needed turning from your back to your side while in a flat bed without using bedrails?: None Help needed moving from lying on your back to sitting on the side of a flat bed without using bedrails?: A Little Help needed moving to and from a bed to a chair (including a wheelchair)?: A Little Help needed standing up from a chair using your arms (e.g., wheelchair or bedside chair)?: A Little Help needed to walk in hospital room?:  A Little Help needed climbing 3-5 steps with a railing? : A Little 6 Click Score: 19    End of Session Equipment Utilized During Treatment: Gait belt Activity Tolerance: Patient limited by fatigue Patient left: in bed (seated on EOB with alarm set. Declined to transfer to recliner) Nurse Communication: Mobility status;Precautions PT Visit Diagnosis: Other abnormalities of gait and mobility (R26.89);Muscle weakness (generalized) (M62.81);Difficulty in walking, not elsewhere classified (R26.2)     Time: 1458-1510 PT Time Calculation (min) (ACUTE ONLY): 12 min  Charges:  $Gait Training: 8-22 mins                     Carl Rivas, [place], DPT [phone]    Carl Rivas 03/06/2021, 4:02 PM

## 2021-03-06 NOTE — Progress Notes (Signed)
Wound care performed this morning.  BLE cleansed with soap and water, lotion applied and new dressings placed on wounds.  Patient tolerated without discomfort or complaints.  Will continue to monitor.

## 2021-03-07 ENCOUNTER — Telehealth: Payer: Self-pay | Admitting: Oncology

## 2021-03-07 ENCOUNTER — Other Ambulatory Visit: Payer: Self-pay

## 2021-03-07 DIAGNOSIS — K5903 Drug induced constipation: Secondary | ICD-10-CM

## 2021-03-07 LAB — BODY FLUID CULTURE W GRAM STAIN: Culture: NO GROWTH

## 2021-03-07 LAB — CBC WITH DIFFERENTIAL/PLATELET
Abs Immature Granulocytes: 0.02 10*3/uL (ref 0.00–0.07)
Basophils Absolute: 0.1 10*3/uL (ref 0.0–0.1)
Basophils Relative: 1 %
Eosinophils Absolute: 0.1 10*3/uL (ref 0.0–0.5)
Eosinophils Relative: 2 %
HCT: 29.1 % — ABNORMAL LOW (ref 39.0–52.0)
Hemoglobin: 9.9 g/dL — ABNORMAL LOW (ref 13.0–17.0)
Immature Granulocytes: 0 %
Lymphocytes Relative: 18 %
Lymphs Abs: 1 10*3/uL (ref 0.7–4.0)
MCH: 34.4 pg — ABNORMAL HIGH (ref 26.0–34.0)
MCHC: 34 g/dL (ref 30.0–36.0)
MCV: 101 fL — ABNORMAL HIGH (ref 80.0–100.0)
Monocytes Absolute: 0.6 10*3/uL (ref 0.1–1.0)
Monocytes Relative: 10 %
Neutro Abs: 4 10*3/uL (ref 1.7–7.7)
Neutrophils Relative %: 69 %
Platelets: 82 10*3/uL — ABNORMAL LOW (ref 150–400)
RBC: 2.88 MIL/uL — ABNORMAL LOW (ref 4.22–5.81)
RDW: 15.9 % — ABNORMAL HIGH (ref 11.5–15.5)
WBC: 5.8 10*3/uL (ref 4.0–10.5)
nRBC: 0 % (ref 0.0–0.2)

## 2021-03-07 LAB — COMPREHENSIVE METABOLIC PANEL
ALT: 24 U/L (ref 0–44)
AST: 145 U/L — ABNORMAL HIGH (ref 15–41)
Albumin: 2 g/dL — ABNORMAL LOW (ref 3.5–5.0)
Alkaline Phosphatase: 121 U/L (ref 38–126)
Anion gap: 8 (ref 5–15)
BUN: 21 mg/dL (ref 8–23)
CO2: 26 mmol/L (ref 22–32)
Calcium: 8.1 mg/dL — ABNORMAL LOW (ref 8.9–10.3)
Chloride: 100 mmol/L (ref 98–111)
Creatinine, Ser: 0.72 mg/dL (ref 0.61–1.24)
GFR, Estimated: >60 mL/min (ref >60)
Glucose, Bld: 101 mg/dL — ABNORMAL HIGH (ref 70–99)
Potassium: 3.4 mmol/L — ABNORMAL LOW (ref 3.5–5.1)
Sodium: 134 mmol/L — ABNORMAL LOW (ref 135–145)
Total Bilirubin: 3.7 mg/dL — ABNORMAL HIGH (ref 0.3–1.2)
Total Protein: 6.4 g/dL — ABNORMAL LOW (ref 6.5–8.1)

## 2021-03-07 LAB — MAGNESIUM: Magnesium: 1.9 mg/dL (ref 1.7–2.4)

## 2021-03-07 MED ORDER — POLYETHYLENE GLYCOL 3350 17 G PO PACK
17.0000 g | PACK | Freq: Every day | ORAL | Status: DC
  Administered 2021-03-07 – 2021-03-08 (×2): 17 g via ORAL
  Filled 2021-03-07 (×2): qty 1

## 2021-03-07 MED ORDER — POTASSIUM CHLORIDE CRYS ER 20 MEQ PO TBCR
40.0000 meq | EXTENDED_RELEASE_TABLET | Freq: Once | ORAL | Status: AC
  Administered 2021-03-07: 40 meq via ORAL
  Filled 2021-03-07: qty 2

## 2021-03-07 MED ORDER — SENNOSIDES-DOCUSATE SODIUM 8.6-50 MG PO TABS: 1.0000 | ORAL_TABLET | Freq: Two times a day (BID) | ORAL | Status: DC | PRN

## 2021-03-07 NOTE — Consult Note (Signed)
[facility] Nurse wound follow up Bedside RN contacted me via [facility] and reports that silver alginate has sufficiently dried exudate and is now adhering to wound and surrounding skin.  I have changed topical therapy to xeroform gauze (antimicrobial, nonadherent) to alleviate adhering and continue to promote wound healing through the use of an antimicrobial dressing.  [place] nursing team will not follow, but will remain available to this patient, the nursing and medical teams.  Please re-consult if needed. Thanks, Maudie Flakes, MSN, RN, [place], Arther Abbott  Pager# [phone]

## 2021-03-07 NOTE — Telephone Encounter (Signed)
His case was presented on tumor board today.  Even though LI RAD 5 definite HCC can not be assigned, tumor board consensus reached upon on this is highly likely [place].  Dr.Wile and Dr. Shuttle on tumor board both feel biopsy of the liver is extremely high risk and is not recommended. Consensus reached on supportive care/ comfort measure be the best option for him I called patient and communicated with him about the tumor board recommendation and he agrees. He wants me to talk to family.  I called brother and not able to reach him. I spoke with sister Butch Penny and updated her the recommendation. She verbalized understanding and agrees with the plan.

## 2021-03-07 NOTE — Progress Notes (Signed)
Daily Progress Note   Patient Name: Carl Rivas       Date: 03/07/2021 DOB: November 24, 1955  Age: 65 y.o. MRN#: [id number] Attending Physician: Dwyane Dee, MD Primary Care Physician: Pcp, No Admit Date: 02/28/2021  Reason for Consultation/Follow-up: Establishing goals of care  Subjective: Chart Reviewed. Updates Received. Patient Assessed.   Patient is awake, alert and oriented x3. Denies shortness of breath. Does complain of some intermittent abdominal pain. States PRN Percocet is helpful. He cannot recall last BM. Discussed starting on bowel regimen to prevent constipation in the setting of narcotics. He verbalized understanding.  We discussed at length goals of care. Carl Rivas states he wants to be at home and is hoping his family will be able to make this work for him to be there. Shares his brother has informed him that he will be happy to assist in any care.   I attempted to reach his brother, [name] as requested to follow-up however unavailable.   Carl Rivas understands his current illness and prognosis. He is in agreement with home with hospice but again request to further confirm and discuss with his brother.   All questions answered and support provided.   Length of Stay: 6 days  Vital Signs: BP 111/90 (BP Location: Right Arm)   Pulse 91   Temp 98 F (36.7 C)   Resp 17   Ht 5\' 10"  (1.778 m)   Wt 121.7 kg   SpO2 97%   BMI 38.50 kg/m  SpO2: SpO2: 97 % O2 Device: O2 Device: Room Air O2 Flow Rate:    Physical Exam: NAD, chronically ill    RRR Firm, tender in RUQ, +BS Bilateral leg wraps AAO x3, mood appropriate             Palliative Care Assessment & Plan  HPI: Palliative Care consult requested for goals of care discussion in this 65 y.o. male with a medical history significant for hepatitis C, cytopenia, obesity, anemia, hypertension, alcoholic liver cirrhosis, lymphedema, and alcohol abuse.  Presented from home with generalized weakness and bilateral lower  extremity edema x1 month.  During work-up you a positive for UTI.  CT of chest abdomen and pelvis showed multiple large nodules in the right lower lobe concerning for metastatic disease or primary malignancy.  Ligament ill-defined low-density hepatic parenchyma suggesting malignancy or metastatic disease.  Cirrhosis, portal hypertension with moderate ascites.  Multiple liver mass.  Oncology consulted and following.  Patient is s/p paracentesis yielding 5 L (03/04/2021).  Code Status:  DNR  Goals of Care/Recommendations:  Updates provided. Patient verbalizes understanding of his current illness and prognosis. Is considering work-up for definitive diagnosis but remains in agreement with returning home with hospice. Request follow-up discussions with his brother. Attempted to call and unable to reach. Message left.   Recommendations for outpatient hospice support in the home. Family previously requested to continue with ongoing discussions amongst themselves. Will follow-back up or wait to hear from family today.   Miralax daily for bowel regimen  Senna PRN for bowels  PMT will continue to support and follow   Prognosis: < 6 months  Discharge Planning: To Be Determined recommendations for home with outpatient hospice support.   Thank you for allowing the Palliative Medicine Team to assist in the care of this patient.  Time Total: 40 min.   Visit consisted of counseling and education dealing with the complex and emotionally intense issues of symptom management and palliative care in the setting of serious and potentially life-threatening  illness.Greater than 50%  of this time was spent counseling and coordinating care related to the above assessment and plan.  Alda Lea, AGPCNP-BC  Palliative Medicine Team [phone]

## 2021-03-07 NOTE — Progress Notes (Signed)
Progress Note    ABYAN CADMAN   TKZ:[id number]  DOB: 06/14/1956  DOA: 02/28/2021     6  PCP: Pcp, No  CC: weakness  Hospital Course: DAVIN ARCHULETTA is a 65 y.o. male with PMH significant formorbid obesity, history of alcoholism, opioid use, cured hepatitis C, liver cirrhosis, thrombocytopenia, chronic anemia, hypertension, prolonged QT, lymphedema.  Patient lives at home alone, his brother lives close by. Patient presented to the ED on 5/19 with ongoing generalized weakness and lower extremity edema, ongoing for a month.  Also reports intermittent shortness of breath as well as leg cramping.  In the ED, hemodynamically stable, breathing on room air Potassium low at 2.8, hemoglobin low at 9.2, platelet low but stable at 70 Urinalysis significant for nitrites, leukocytes, bacteria, hemoglobin.  Admitted to hospitalist service for further evaluation management  5/20, CT chest, abdomen and pelvis with significant findings as below 1. Multiple large nodules are noted in the right lower lobe consistent with metastatic disease or primary malignancy. 2. Multiple ill-defined low densities in the hepatic parenchyma suggesting malignancy or metastatic disease. 3. Liver cirrhosis, portal hypertension, moderate ascites 4. Large periumbilical hernia is noted.  5/21, MRI abdomen was obtained which showed 1. Infiltrative mass in the liver with extensive tumor thrombus involving all branches of the portal vein, extending from the main portal vein into RIGHT and LEFT portal venous branches. Likely hepatocellular carcinoma with portal venous invasion, nodal and pulmonary metastatic disease.  2. Associated with large collateral pathways in the upper abdomen and large volume ascites.  He was evaluated by oncology and his case was also discussed at tumor board.  This was considered to be a very highly likely hepatocellular carcinoma and recommendation is for supportive care and hospice.  It was  considered too high risk for undergoing biopsy and this was communicated to the patient by oncology who was in agreement and understanding.  Family also similarly has been updated and made aware. Palliative care has been consulted during hospitalization and plan will be for discharging patient home with hospice in place.  Interval History:  No events overnight.  Patient tired but comfortable and resting in bed when seen this morning. He has been seen by oncology and informed that his diagnosis is most likely consistent with [facility] and he voiced understanding. Tentative plan is for discharging patient home tomorrow with hospice.  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 -Oncology consult obtained with Dr. Tasia Catchings.  Cytology of ascitic fluid negative for malignant cells.   -Case discussed by oncology during the tumor board.  General consensus remains that this is most likely [facility] but biopsy is considered too high risk.  Recommendations are for comfort care/hospice - Patient remains in agreement and family has been updated as well - Tentative plan is for discharging home on 03/08/2021 with hospice  Portal vein thrombosis -With MRI findings as above, portal vein invasion by hepatocellular carcinoma with extensive tumor thrombus involving all branches of the liver. - not an anticoagulation candidate per oncology  Liver cirrhosis/portal hypertension/moderate ascites -Liver cirrhosis secondary to h/o alcoholism, IV  drugs  and hepatitis C (cured).  Patient reports he is no longer using alcohol or drugs.  -Not on any medicines at home.  Does not follow with GI as an outpatient -5/23 underwent paracentesis, 5 L of straw-colored ascitic fluid drained.   -Currently has compression bandage on both lower extremities. -5/21, started on Lasix IV 40 mg daily.  Continue the same. -Continue to monitor blood pressure, electrolytes and renal function  E. coli UTI -Urinalysis significant for nitrites, leukocytes, bacteria -Urine culture with more than 100,000 CFU per mL of E. Coli, pansensitive. -Completed Rocephin course during hospitalization  Hypokalemia/hypophosphatemia -Due to poor nutrition as well as IV Lasix.  Replacement ordered  Intermittent bloody stool - FOBT positive Chronic anemia History of folic acid deficiency -Reports intermittent dark stool in the setting of liver cirrhosis.  -Baseline hemoglobin was 12 a year ago, presented with hemoglobin low at 9.2.  Despite FOBT positive, hemoglobin remains stable so far.  Thrombocytopenia -Running low.  Likely related to liver cirrhosis no active bleeding.  Continue to monitor  Prolonged QTC -QTC is prolonged in the setting of hypokalemia.  Continue to monitor potassium level. -Avoid QT prolonging medications  Hypoalbuminemia -Albumin level low at 2.1, probably related to liver cirrhosis.  Chronic incarcerated abdominal hernia -He was recently at [facility] for chronic abdominal pain and evaluation which included imaging showing a chronic incarcerated hernia.  He was evaluated by general surgery which indicated no need for urgent intervention. Now likely not a candidate for surgery if needed but remains stable  Chronic wounds- POA -Patient has chronic cellulitis, poor hygiene of bilateral lower extremities.  There is an open wound on the left lower leg and back of his thigh.  Discussed with wound care nurse.  No need of surgical debridement. Local wound care  recommended. - see WOC recommendations   Goals of care -Poor prognosis because of advanced liver cirrhosis and a new diagnosis of presumed hepatocellular carcinoma.  Hospice service recommended.  Palliative care consult obtained.  Patient and family agreed to DNR status at this time.   -See above.  Plan will be for discharging patient home with hospice  Old records reviewed in assessment of this patient   DVT prophylaxis: SCDs Start: 03/01/21 0002   Code Status:   Code Status: DNR Family Communication:   Disposition Plan: Status is: Inpatient  Remains inpatient appropriate because:Ongoing diagnostic testing needed not appropriate for outpatient work up, IV treatments appropriate due to intensity of illness or inability to take PO and Inpatient level of care appropriate due to severity of illness   Dispo: The patient is from: Home              Anticipated d/c is to: Home              Patient currently is not medically stable to d/c.   Difficult to place patient No  Risk of unplanned readmission score: Unplanned Admission- Pilot do not use: 13.83   Objective: Blood pressure 135/64, pulse 79, temperature 97.8 F (36.6 C), resp. rate 18, height 5\' 10"  (1.778 m), weight 121.7 kg, SpO2 97 %.  Examination: General appearance: alert, cooperative and no distress Head: Normocephalic, without obvious abnormality, atraumatic Eyes: EOMI Lungs: clear to auscultation bilaterally Heart: regular rate and rhythm and S1, S2 normal Abdomen: Obese, lower middle hernia appreciated with mild firmness.  Tenderness to palpation noted in right upper quadrant.  Bowel sounds present Extremities: 2+ bilateral lower extremity pitting edema Skin: Chronic stasis changes noted  in bilateral lower extremities Neurologic: Grossly normal  Consultants:   Oncology  Palliative care medicine  Procedures:     Data Reviewed: I have personally reviewed following labs and imaging studies Results for orders  placed or performed during the hospital encounter of 02/28/21 (from the past 24 hour(s))  CBC with Differential/Platelet     Status: Abnormal   Collection Time: 03/07/21  5:34 AM  Result Value Ref Range   WBC 5.8 4.0 - 10.5 K/uL   RBC 2.88 (L) 4.22 - 5.81 MIL/uL   Hemoglobin 9.9 (L) 13.0 - 17.0 g/dL   HCT 29.1 (L) 39.0 - 52.0 %   MCV 101.0 (H) 80.0 - 100.0 fL   MCH 34.4 (H) 26.0 - 34.0 pg   MCHC 34.0 30.0 - 36.0 g/dL   RDW 15.9 (H) 11.5 - 15.5 %   Platelets 82 (L) 150 - 400 K/uL   nRBC 0.0 0.0 - 0.2 %   Neutrophils Relative % 69 %   Neutro Abs 4.0 1.7 - 7.7 K/uL   Lymphocytes Relative 18 %   Lymphs Abs 1.0 0.7 - 4.0 K/uL   Monocytes Relative 10 %   Monocytes Absolute 0.6 0.1 - 1.0 K/uL   Eosinophils Relative 2 %   Eosinophils Absolute 0.1 0.0 - 0.5 K/uL   Basophils Relative 1 %   Basophils Absolute 0.1 0.0 - 0.1 K/uL   Immature Granulocytes 0 %   Abs Immature Granulocytes 0.02 0.00 - 0.07 K/uL  Comprehensive metabolic panel     Status: Abnormal   Collection Time: 03/07/21  5:34 AM  Result Value Ref Range   Sodium 134 (L) 135 - 145 mmol/L   Potassium 3.4 (L) 3.5 - 5.1 mmol/L   Chloride 100 98 - 111 mmol/L   CO2 26 22 - 32 mmol/L   Glucose, Bld 101 (H) 70 - 99 mg/dL   BUN 21 8 - 23 mg/dL   Creatinine, Ser 0.72 0.61 - 1.24 mg/dL   Calcium 8.1 (L) 8.9 - 10.3 mg/dL   Total Protein 6.4 (L) 6.5 - 8.1 g/dL   Albumin 2.0 (L) 3.5 - 5.0 g/dL   AST 145 (H) 15 - 41 U/L   ALT 24 0 - 44 U/L   Alkaline Phosphatase 121 38 - 126 U/L   Total Bilirubin 3.7 (H) 0.3 - 1.2 mg/dL   GFR, Estimated >60 >60 mL/min   Anion gap 8 5 - 15  Magnesium     Status: None   Collection Time: 03/07/21  5:34 AM  Result Value Ref Range   Magnesium 1.9 1.7 - 2.4 mg/dL    Recent Results (from the past 240 hour(s))  Urine culture     Status: Abnormal   Collection Time: 02/28/21  4:34 PM   Specimen: Urine, Random  Result Value Ref Range Status   Specimen Description   Final    URINE, RANDOM Performed at  [facility], [address], [place], [place] [address]    Special Requests   Final    NONE Performed at [facility], [address] [place], [place] [address]    Culture >=100,000 COLONIES/mL ESCHERICHIA COLI (A)  Final   Report Status 03/03/2021 FINAL  Final   Organism ID, Bacteria ESCHERICHIA COLI (A)  Final      Susceptibility   Escherichia coli - MIC*    AMPICILLIN <=2 SENSITIVE Sensitive     CEFAZOLIN <=4 SENSITIVE Sensitive     CEFEPIME <=0.12 SENSITIVE Sensitive  CEFTRIAXONE <=0.25 SENSITIVE Sensitive     CIPROFLOXACIN <=0.25 SENSITIVE Sensitive     GENTAMICIN <=1 SENSITIVE Sensitive     IMIPENEM <=0.25 SENSITIVE Sensitive     NITROFURANTOIN <=16 SENSITIVE Sensitive     TRIMETH/SULFA <=20 SENSITIVE Sensitive     AMPICILLIN/SULBACTAM <=2 SENSITIVE Sensitive     PIP/TAZO <=4 SENSITIVE Sensitive     * >=100,000 COLONIES/mL ESCHERICHIA COLI  Resp Panel by RT-PCR (Flu A&B, Covid) Nasopharyngeal Swab     Status: None   Collection Time: 02/28/21 11:52 PM   Specimen: Nasopharyngeal Swab; Nasopharyngeal(NP) swabs in vial transport medium  Result Value Ref Range Status   SARS Coronavirus 2 by RT PCR NEGATIVE NEGATIVE Final    Comment: (NOTE) SARS-CoV-2 target nucleic acids are NOT DETECTED.  The SARS-CoV-2 RNA is generally detectable in upper respiratory specimens during the acute phase of infection. The lowest concentration of SARS-CoV-2 viral copies this assay can detect is 138 copies/mL. A negative result does not preclude SARS-Cov-2 infection and should not be used as the sole basis for treatment or other patient management decisions. A negative result may occur with  improper specimen collection/handling, submission of specimen other than nasopharyngeal swab, presence of viral mutation(s) within the areas targeted by this assay, and inadequate number of viral copies(<138 copies/mL). A negative result must be combined with clinical observations,  patient history, and epidemiological information. The expected result is Negative.  Fact Sheet for Patients:  EntrepreneurPulse.com.au  Fact Sheet for Healthcare Providers:  IncredibleEmployment.be  This test is no t yet approved or cleared by the [place] FDA and  has been authorized for detection and/or diagnosis of SARS-CoV-2 by FDA under an Emergency Use Authorization (EUA). This EUA will remain  in effect (meaning this test can be used) for the duration of the COVID-19 declaration under Section 564(b)(1) of the Act, 21 U.S.C.section 360bbb-3(b)(1), unless the authorization is terminated  or revoked sooner.       Influenza A by PCR NEGATIVE NEGATIVE Final   Influenza B by PCR NEGATIVE NEGATIVE Final    Comment: (NOTE) The Xpert Xpress SARS-CoV-2/FLU/RSV plus assay is intended as an aid in the diagnosis of influenza from Nasopharyngeal swab specimens and should not be used as a sole basis for treatment. Nasal washings and aspirates are unacceptable for Xpert Xpress SARS-CoV-2/FLU/RSV testing.  Fact Sheet for Patients: EntrepreneurPulse.com.au  Fact Sheet for Healthcare Providers: IncredibleEmployment.be  This test is not yet approved or cleared by the [place] FDA and has been authorized for detection and/or diagnosis of SARS-CoV-2 by FDA under an Emergency Use Authorization (EUA). This EUA will remain in effect (meaning this test can be used) for the duration of the COVID-19 declaration under Section 564(b)(1) of the Act, 21 U.S.C. section 360bbb-3(b)(1), unless the authorization is terminated or revoked.  Performed at [facility], [address] [place], [place] [address]   Body fluid culture w Gram Stain     Status: None   Collection Time: 03/04/21 10:24 AM   Specimen: PATH Cytology Peritoneal fluid  Result Value Ref Range Status   Specimen Description   Final     PERITONEAL Performed at [facility], [address], [place], [place] [address]    Special Requests   Final    NONE Performed at [facility], [address] [place], [place] [address]    Gram Stain   Final    WBC PRESENT,BOTH PMN AND MONONUCLEAR NO ORGANISMS SEEN CYTOSPIN SMEAR    Culture  Final    NO GROWTH 3 DAYS Performed at [facility], [place] [address], [place], [place] [address]    Report Status 03/07/2021 FINAL  Final     Radiology Studies: No results found. US Paracentesis  Final Result    MR ABDOMEN W WO CONTRAST  Final Result  Addendum 1 of 1  ADDENDUM REPORT: 03/05/2021 21:15    ADDENDUM:  Imaging findings as reported by strict LI-RADS definition,  categorized as "LR-TIV, probably due to hepatocellular carcinoma" .  Given the history of hepatitis C, history of liver disease as well  as elevated AFP, findings are compatible with Irene as outlined in the  initial report.      Electronically Signed    By: Zetta Bills [name]    On: 03/05/2021 21:15      Final    CT ABDOMEN WO CONTRAST  Final Result    CT CHEST WO CONTRAST  Final Result    US Abdomen Limited RUQ (LIVER/GB)  Final Result  Addendum 1 of 1  ADDENDUM REPORT: 03/01/2021 14:14    ADDENDUM:  These results were called by telephone at the time of interpretation  on 03/01/2021 at 2:14 pm to provider [facility] , who verbally  acknowledged these results.      Electronically Signed    By: Zetta Bills [name]    On: 03/01/2021 14:14      Final      Scheduled Meds: . furosemide  40 mg Intravenous Daily  . Gerhardt's butt cream   Topical TID  . polyethylene glycol  17 g Oral Daily  . sodium chloride flush  3 mL Intravenous Q12H   PRN Meds: oxyCODONE-acetaminophen, senna-docusate, sodium chloride Continuous Infusions:    LOS: 6 days  Time spent: Greater than 50% of the 35 minute visit was spent in counseling/coordination of care for the patient as  laid out in the A&P.   Dwyane Dee, MD [facility] 03/07/2021, 5:57 PM

## 2021-03-07 NOTE — Progress Notes (Addendum)
Tumor Board Documentation  Carl Rivas was presented by Dr Tasia Catchings at our Tumor Board on 03/07/2021, which included representatives from medical oncology,radiation oncology,surgical,radiology,pathology,navigation,internal medicine,palliative care,research,genetics,pharmacy.  Carl Rivas currently presents as a new patient,for MDC,for new positive pathology with history of the following treatments: active survellience.  Additionally, we reviewed previous medical and familial history, history of present illness, and recent lab results along with all available histopathologic and imaging studies. The tumor board considered available treatment options and made the following recommendations:   Consensus is for Comfort Care, Pt is wanting a bx for confirmation and possible treatment. Md will rediscuss with patient for comfort care due to comorbidities  The following procedures/referrals were also placed: No orders of the defined types were placed in this encounter.   Clinical Trial Status: not discussed   Staging used: Clinical Stage  AJCC Staging:     Group: Probable Hepatocellular Carcinoma High risk for biopsy and not recommended.  Recommend supportive care/comfort measure.    National site-specific guidelines   were discussed with respect to the case.  Tumor board is a meeting of clinicians from various specialty areas who evaluate and discuss patients for whom a multidisciplinary approach is being considered. Final determinations in the plan of care are those of the provider(s). The responsibility for follow up of recommendations given during tumor board is that of the provider.   Today's extended care, comprehensive team conference, Carl Rivas was not present for the discussion and was not examined.   Multidisciplinary Tumor Board is a multidisciplinary case peer review process.  Decisions discussed in the Multidisciplinary Tumor Board reflect the opinions of the specialists present at the  conference without having examined the patient.  Ultimately, treatment and diagnostic decisions rest with the primary provider(s) and the patient.

## 2021-03-08 MED ORDER — GERHARDT'S BUTT CREAM: 1.0000 "application " | TOPICAL_CREAM | Freq: Three times a day (TID) | CUTANEOUS | 6 refills | Status: AC

## 2021-03-08 MED ORDER — OXYCODONE HCL 5 MG PO TABS: 10.0000 mg | ORAL_TABLET | ORAL | Status: DC | PRN

## 2021-03-08 MED ORDER — SENNOSIDES-DOCUSATE SODIUM 8.6-50 MG PO TABS: 1.0000 | ORAL_TABLET | Freq: Two times a day (BID) | ORAL | Status: AC | PRN

## 2021-03-08 MED ORDER — OXYCODONE HCL 10 MG PO TABS: 10.0000 mg | ORAL_TABLET | ORAL | 0 refills | Status: AC | PRN

## 2021-03-08 MED ORDER — MORPHINE SULFATE (PF) 4 MG/ML IV SOLN
4.0000 mg | Freq: Once | INTRAVENOUS | Status: AC
  Administered 2021-03-08: 15:00:00 4 mg via INTRAVENOUS
  Filled 2021-03-08: qty 1

## 2021-03-08 NOTE — TOC Transition Note (Signed)
Transition of Care [facility]) - CM/SW Discharge Note   Patient Details  Name: Carl Rivas MRN: [id number] Date of Birth: 10/27/1955  Transition of [facility]) CM/SW Contact:  Shelbie Hutching, RN Phone Number: 03/08/2021, 1:43 PM   Clinical Narrative:    Patient will discharge home today under hospice care.  Patient's brother [name] chooses [facility].  Anderson Malta with [facility] accepted home hospice referral.  Patient needs a hospital bed, over the bed table, and wheelchair.  Patient will be going to his brother, [name], home.  Patient and family have requested EMS transport.  Patient is very weak.  EMS will be arranged once confirmation on equipment delivery has been verified.     Final next level of care: Home w Hospice Care Barriers to Discharge: Barriers Resolved   Patient Goals and CMS Choice Patient states their goals for this hospitalization and ongoing recovery are:: Patient and family have decided on hospice care at home CMS Medicare.gov Compare Post Acute Care list provided to:: Patient Choice offered to / list presented to : [place]  Discharge Placement                Patient to be transferred to facility by: [facility] to take patient home Name of family member notified: [name] Patient and family notified of of transfer: 03/08/21  Discharge Plan and Services   Discharge Planning Services: CM Consult Post Acute Care Choice: Hospice          DME Arranged: Hospital bed,Wheelchair manual DME Agency: [facility] [place] ([place]) Date DME Agency Contacted: 03/08/21 Time DME Agency Contacted: 64 Representative spoke with at DME Agency: Anderson Malta HH Arranged: NA [place] Agency: NA        Social Determinants of Health ([place]) Interventions     Readmission Risk Interventions No flowsheet data found.

## 2021-03-08 NOTE — Progress Notes (Signed)
PT Cancellation Note  Patient Details Name: Carl Rivas MRN: [id number] DOB: 1956-04-04   Cancelled Treatment:    Reason Eval/Treat Not Completed: Other (comment).  Pt resting in bed upon PT arrival.  Pt reporting not feeling great today and did not sleep last night and has not been able to get sleep today either.  Pt reporting he walked yesterday and did not feel up to doing anything at this time.  Pt reports plan to discharge home today and wanting to save energy for going home.  Will re-attempt PT treatment at a later date/time if pt does not discharge home today.  Leitha Bleak, PT 03/08/21, 12:09 PM

## 2021-03-08 NOTE — Progress Notes (Signed)
Hematology/Oncology Progress Note [facility] [phone] [phone]  Patient Care Team: Pcp, No as PCP - General   Name of the patient: Carl Rivas  [id number]  [birth year]  Date of visit: 03/08/21   INTERVAL HISTORY-  No acute overnight events.  Continues to feel weak.  Review of systems- Review of Systems  Constitutional: Positive for fatigue. Negative for appetite change, chills, fever and unexpected weight change.  HENT:   Negative for hearing loss and voice change.   Eyes: Negative for eye problems and icterus.  Respiratory: Positive for shortness of breath. Negative for chest tightness and cough.   Cardiovascular: Negative for chest pain and leg swelling.  Gastrointestinal: Negative for abdominal pain.  Endocrine: Negative for hot flashes.  Genitourinary: Negative for difficulty urinating, dysuria and frequency.   Musculoskeletal: Negative for arthralgias.  Skin: Negative for itching and rash.  Neurological: Negative for light-headedness and numbness.  Hematological: Negative for adenopathy. Does not bruise/bleed easily.  Psychiatric/Behavioral: Negative for confusion.    Allergies  Allergen Reactions  . Penicillins Hives    Patient Active Problem List   Diagnosis Date Noted  . Goals of care, counseling/discussion   . Swelling   . Lung mass   . Liver mass   . Complicated UTI (urinary tract infection) 03/01/2021  . Hypokalemia 03/01/2021  . Anemia 03/01/2021  . Blood in stool 03/01/2021  . Lymphedema of both lower extremities 03/01/2021  . Pressure injury of skin 03/01/2021  . Folate deficiency 04/03/2020  . Hepatitis C virus infection cured after antiviral drug therapy 11/22/2018  . Essential hypertension 08/17/2018  . Prolonged Q-T interval on ECG 08/17/2018  . Secondary thrombocytopenia 08/17/2018  . Opiate abuse, episodic ([place]) 08/13/2012  . Cirrhosis of liver ([place]) 12/12/2011     Past Medical History:   Diagnosis Date  . Cirrhosis ([place])   . Hepatitis C virus infection cured after antiviral drug therapy       Social History   Socioeconomic History  . Marital status: Single    Spouse name: Not on file  . Number of children: Not on file  . Years of education: Not on file  . Highest education level: Not on file  Occupational History  . Not on file  Tobacco Use  . Smoking status: Former Research scientist (life sciences)  . Smokeless tobacco: Never Used  Substance and Sexual Activity  . Alcohol use: Not Currently  . Drug use: Not Currently  . Sexual activity: Not on file  Other Topics Concern  . Not on file  Social History Narrative  . Not on file   Social Determinants of Health   Financial Resource Strain: Not on file  Food Insecurity: Not on file  Transportation Needs: Not on file  Physical Activity: Not on file  Stress: Not on file  Social Connections: Not on file  Intimate Partner Violence: Not on file     Family History  Problem Relation Age of Onset  . Colon polyps Mother   . Cancer Neg Hx      Current Facility-Administered Medications:  .  furosemide (LASIX) injection 40 mg, 40 mg, Intravenous, Daily, Dahal, Binaya, MD, 40 mg at 03/08/21 1050 .  Gerhardt's butt cream, , Topical, TID, Terrilee Croak, MD, Given at 03/08/21 1053 .  morphine 4 MG/ML injection 4 mg, 4 mg, Intravenous, Once, Girguis, David, MD .  oxyCODONE (Oxy IR/ROXICODONE) immediate release tablet 10 mg, 10 mg, Oral, Q4H PRN, Dwyane Dee, MD .  polyethylene glycol (MIRALAX / Floria Raveling)  packet 17 g, 17 g, Oral, Daily, Pickenpack-Cousar, Athena N, NP, 17 g at 03/08/21 1050 .  senna-docusate (Senokot-S) tablet 1 tablet, 1 tablet, Oral, BID PRN, Pickenpack-Cousar, Athena N, NP .  sodium chloride (OCEAN) 0.65 % nasal spray 1 spray, 1 spray, Each Nare, PRN, Dahal, Binaya, MD .  sodium chloride flush (NS) 0.9 % injection 3 mL, 3 mL, Intravenous, Q12H, Marcelyn Bruins, MD, 3 mL at 03/08/21 1053   Physical exam:  Vitals:    03/08/21 0449 03/08/21 0500 03/08/21 0858 03/08/21 1147  BP: 138/70  133/72 (!) 150/73  Pulse: 93  92 (!) 102  Resp: 18  15 16   Temp: 98 F (36.7 C)  97.9 F (36.6 C) 98 F (36.7 C)  TempSrc: Oral  Oral Oral  SpO2: 96%  95% 96%  Weight:  262 lb 9.1 oz (119.1 kg)    Height:       Physical Exam Constitutional:      General: He is not in acute distress.    Appearance: He is obese. He is not ill-appearing.  HENT:     Head: Normocephalic and atraumatic.     Nose: Nose normal.     Mouth/Throat:     Pharynx: No oropharyngeal exudate.  Eyes:     Pupils: Pupils are equal, round, and reactive to light.  Cardiovascular:     Rate and Rhythm: Normal rate and regular rhythm.     Heart sounds: No murmur heard.   Pulmonary:     Effort: Pulmonary effort is normal. No respiratory distress.     Breath sounds: No rales.  Chest:     Chest wall: No tenderness.  Abdominal:     General: There is distension.     Palpations: Abdomen is soft.     Tenderness: There is no abdominal tenderness.  Musculoskeletal:        General: Swelling present. Normal range of motion.     Cervical back: Normal range of motion and neck supple.  Skin:    General: Skin is warm and dry.     Findings: No erythema.  Neurological:     Mental Status: He is alert and oriented to person, place, and time. Mental status is at baseline.     Motor: No abnormal muscle tone.     Coordination: Coordination normal.  Psychiatric:        Mood and Affect: Affect normal.        CMP Latest Ref Rng & Units 03/07/2021  Glucose 70 - 99 mg/dL 101(H)  BUN 8 - 23 mg/dL 21  Creatinine 0.61 - 1.24 mg/dL 0.72  Sodium 135 - 145 mmol/L 134(L)  Potassium 3.5 - 5.1 mmol/L 3.4(L)  Chloride 98 - 111 mmol/L 100  CO2 22 - 32 mmol/L 26  Calcium 8.9 - 10.3 mg/dL 8.1(L)  Total Protein 6.5 - 8.1 g/dL 6.4(L)  Total Bilirubin 0.3 - 1.2 mg/dL 3.7(H)  Alkaline Phos 38 - 126 U/L 121  AST 15 - 41 U/L 145(H)  ALT 0 - 44 U/L 24   CBC Latest  Ref Rng & Units 03/07/2021  WBC 4.0 - 10.5 K/uL 5.8  Hemoglobin 13.0 - 17.0 g/dL 9.9(L)  Hematocrit 39.0 - 52.0 % 29.1(L)  Platelets 150 - 400 K/uL 82(L)    RADIOGRAPHIC STUDIES: I have personally reviewed the radiological images as listed and agreed with the findings in the report. CT ABDOMEN WO CONTRAST  Result Date: 03/01/2021 CLINICAL DATA:  Hepatic cirrhosis.  Anemia. EXAM: CT CHEST, ABDOMEN  AND PELVIS WITHOUT CONTRAST TECHNIQUE: Multidetector CT imaging of the chest and abdomen was performed following the standard protocol without IV contrast. COMPARISON:  None. FINDINGS: CT CHEST FINDINGS Cardiovascular: Atherosclerosis of thoracic aorta is noted without aneurysm formation. Coronary calcifications are noted. Normal cardiac size. No pericardial effusion. Mediastinum/Nodes: No enlarged mediastinal, hilar, or axillary lymph nodes. Thyroid gland, trachea, and esophagus demonstrate no significant findings. Lungs/Pleura: No pneumothorax or pleural effusion is noted. Multiple large nodules are noted in the right lower lobe concerning for malignancy and metastatic disease. Largest nodule measures 2.7 x 2.2 cm. 7 mm nodule is noted in left upper lobe best seen on image number 35 of series 4. Musculoskeletal: No chest wall mass or suspicious bone lesions identified. CT ABDOMEN FINDINGS Hepatobiliary: Nodular hepatic contours are noted consistent with hepatic cirrhosis. Moderate amount of ascites is noted around the liver. Cholelithiasis is noted. No biliary dilatation is noted. Ill-defined low densities are noted throughout the hepatic parenchyma suggesting possible malignancy or metastatic disease. Pancreas: Unremarkable. No pancreatic ductal dilatation or surrounding inflammatory changes. Spleen: Moderate splenomegaly is noted. Adrenals/Urinary Tract: Adrenal glands and kidneys are unremarkable. Stomach/Bowel: The stomach and visualized bowel loops are unremarkable. Vascular/Lymphatic: Atherosclerosis of  abdominal aorta is noted without aneurysm formation. Collateral veins are noted suggesting portal hypertension. No definite significant adenopathy is noted. Other: Moderate ascites is noted around the liver and spleen. Large periumbilical hernia is noted. Musculoskeletal: No acute or significant osseous findings. IMPRESSION: Multiple large nodules are noted in the right lower lobe consistent with metastatic disease or primary malignancy. Findings consistent with hepatic cirrhosis. There appears to be multiple ill-defined low densities in the hepatic parenchyma suggesting malignancy or metastatic disease. Further evaluation with MRI is recommended. Moderate splenomegaly is noted with collateral veins present suggesting portal hypertension. Moderate ascites is noted around the liver and spleen. Large periumbilical hernia is noted. Coronary artery calcifications are noted suggesting coronary artery disease. Aortic Atherosclerosis (ICD10-I70.0). Electronically Signed   By: Marijo Conception [name]   On: 03/01/2021 17:25   CT CHEST WO CONTRAST  Result Date: 03/01/2021 CLINICAL DATA:  Hepatic cirrhosis.  Anemia. EXAM: CT CHEST, ABDOMEN AND PELVIS WITHOUT CONTRAST TECHNIQUE: Multidetector CT imaging of the chest and abdomen was performed following the standard protocol without IV contrast. COMPARISON:  None. FINDINGS: CT CHEST FINDINGS Cardiovascular: Atherosclerosis of thoracic aorta is noted without aneurysm formation. Coronary calcifications are noted. Normal cardiac size. No pericardial effusion. Mediastinum/Nodes: No enlarged mediastinal, hilar, or axillary lymph nodes. Thyroid gland, trachea, and esophagus demonstrate no significant findings. Lungs/Pleura: No pneumothorax or pleural effusion is noted. Multiple large nodules are noted in the right lower lobe concerning for malignancy and metastatic disease. Largest nodule measures 2.7 x 2.2 cm. 7 mm nodule is noted in left upper lobe best seen on image number 35 of  series 4. Musculoskeletal: No chest wall mass or suspicious bone lesions identified. CT ABDOMEN FINDINGS Hepatobiliary: Nodular hepatic contours are noted consistent with hepatic cirrhosis. Moderate amount of ascites is noted around the liver. Cholelithiasis is noted. No biliary dilatation is noted. Ill-defined low densities are noted throughout the hepatic parenchyma suggesting possible malignancy or metastatic disease. Pancreas: Unremarkable. No pancreatic ductal dilatation or surrounding inflammatory changes. Spleen: Moderate splenomegaly is noted. Adrenals/Urinary Tract: Adrenal glands and kidneys are unremarkable. Stomach/Bowel: The stomach and visualized bowel loops are unremarkable. Vascular/Lymphatic: Atherosclerosis of abdominal aorta is noted without aneurysm formation. Collateral veins are noted suggesting portal hypertension. No definite significant adenopathy is noted. Other: Moderate ascites is noted  around the liver and spleen. Large periumbilical hernia is noted. Musculoskeletal: No acute or significant osseous findings. IMPRESSION: Multiple large nodules are noted in the right lower lobe consistent with metastatic disease or primary malignancy. Findings consistent with hepatic cirrhosis. There appears to be multiple ill-defined low densities in the hepatic parenchyma suggesting malignancy or metastatic disease. Further evaluation with MRI is recommended. Moderate splenomegaly is noted with collateral veins present suggesting portal hypertension. Moderate ascites is noted around the liver and spleen. Large periumbilical hernia is noted. Coronary artery calcifications are noted suggesting coronary artery disease. Aortic Atherosclerosis (ICD10-I70.0). Electronically Signed   By: Marijo Conception [name]   On: 03/01/2021 17:25   MR ABDOMEN W WO CONTRAST  Addendum Date: 03/05/2021   ADDENDUM REPORT: 03/05/2021 21:15 ADDENDUM: Imaging findings as reported by strict LI-RADS definition, categorized as  "LR-TIV, probably due to hepatocellular carcinoma" . Given the history of hepatitis C, history of liver disease as well as elevated AFP, findings are compatible with Donna as outlined in the initial report. Electronically Signed   By: Zetta Bills [name]   On: 03/05/2021 21:15   Result Date: 03/05/2021 CLINICAL DATA:  Hepatic cirrhosis, suspected malignancy. Question of mass seen on CT imaging of the chest. EXAM: MRI ABDOMEN WITHOUT AND WITH CONTRAST TECHNIQUE: Multiplanar multisequence MR imaging of the abdomen was performed both before and after the administration of intravenous contrast. CONTRAST:  76mL GADAVIST GADOBUTROL 1 MMOL/ML IV SOLN COMPARISON:  CT of the chest and CT of the abdomen from Mar 01, 2021. FINDINGS: Lower chest: Nodules and masses at the RIGHT lung base better seen on recent chest CT. Hepatobiliary: Marked hepatic cirrhosis. Expansile portal venous invasion in diffuse signal abnormality in the central liver. Areas lack frank arterial phase enhancement, unclear whether this could be an issue of bolus timing based on images presented. Signal is present in vascular structures and there is apparent enhancement of viscera on the first of the T1 weighted images. Diffusion weighted abnormality measuring as much as 16 by 9 cm in the central liver tracking through areas of both LEFT and RIGHT hepatic lobe as well as the caudate. Expansile portal venous thrombus with slight increase in signal intensity from 113 signal intensity units to approximately 150 signal intensity units over the course of the study. Discrete mass from which this process arises is not visualized. More hypointense area seen on image 23 of series 11 in the posterior RIGHT liver with characteristics of washout measuring 10 x 6 cm and contiguous with the caudate lobe and other areas of branching low signal in the portal venous system is compatible more likely with the primary tumor. Smaller satellite lesions are present as well, for  instance in the anterior LEFT hepatic lobe (image 21/11) 13 mm area. Main portal vein with increased signal intensity from pre to postcontrast as well. The SMV shows flow based on enhancement into the splenic portal confluence. There are large collateral pathways in the upper abdomen with gastric and esophageal varices. Large lymph node at the celiac axis measuring up to 2.5 cm also with restricted diffusion. Pancreas:  No focal pancreatic abnormality Spleen:  Splenomegaly.  No focal lesion. Adrenals/Urinary Tract: Adrenal glands are normal. Symmetric renal enhancement. Small cysts in the bilateral kidneys. Stomach/Bowel: Generalized bowel edema in the setting of ascites and portal hypertension. No acute findings to the extent evaluated on abdominal MRI. 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 Necrotic appearing celiac node. Other:  Large volume ascites. Musculoskeletal: No suspicious  bone lesions identified. IMPRESSION: 1. Infiltrative mass in the liver with extensive tumor thrombus involving all branches of the portal vein, extending from the main portal vein into RIGHT and LEFT portal venous branches. Likely hepatocellular carcinoma with portal venous invasion, nodal and pulmonary metastatic disease. Correlation with alpha fetoprotein levels is suggested. 2. Associated with large collateral pathways in the upper abdomen and large volume ascites. Electronically Signed: By: Zetta Bills [name] On: 03/03/2021 10:25   US Paracentesis  Result Date: 03/04/2021 INDICATION: Patient with history of alcoholism, cured hepatitis C, liver cirrhosis. Presented to the ED with lower extremity edema and generalized weakness. Found to have ascites. Request is for therapeutic and diagnostic paracentesis EXAM: ULTRASOUND GUIDED THERAPEUTIC AND DIAGNOSTIC PARACENTESIS MEDICATIONS: Lidocaine 1% 10 mL COMPLICATIONS: None immediate. PROCEDURE: Informed written consent was obtained from the patient after a  discussion of the risks, benefits and alternatives to treatment. A timeout was performed prior to the initiation of the procedure. Initial ultrasound scanning demonstrates a moderate amount of ascites within the right lower abdominal quadrant. The right lower abdomen was prepped and draped in the usual sterile fashion. 1% lidocaine was used for local anesthesia. Following this, a 19 gauge, 10-cm, Yueh catheter was introduced. An ultrasound image was saved for documentation purposes. The paracentesis was performed. The catheter was removed and a dressing was applied. The patient tolerated the procedure well without immediate post procedural complication. Patient received post-procedure intravenous albumin; see nursing notes for details. FINDINGS: A total of approximately 5 L of straw-colored fluid was removed. Samples were sent to the laboratory as requested by the clinical team. IMPRESSION: Successful ultrasound-guided therapeutic and diagnostic paracentesis yielding 5 liters of peritoneal fluid. Read by: Rushie Nyhan, NP Electronically Signed   By: Markus Daft [name]   On: 03/04/2021 11:20   US Abdomen Limited RUQ (LIVER/GB)  Addendum Date: 03/01/2021   ADDENDUM REPORT: 03/01/2021 14:14 ADDENDUM: These results were called by telephone at the time of interpretation on 03/01/2021 at 2:14 pm to provider [facility] , who verbally acknowledged these results. Electronically Signed   By: Zetta Bills [name]   On: 03/01/2021 14:14   Result Date: 03/01/2021 CLINICAL DATA:  Cirrhosis, anasarca. EXAM: ULTRASOUND ABDOMEN LIMITED RIGHT UPPER QUADRANT COMPARISON:  Abdominal sonogram from 2007. FINDINGS: Gallbladder: Marked gallbladder wall thickening in the presence of ascites and marked liver disease. No reported tenderness over the gallbladder. Gallbladder wall thickness in excess of 7 mm. Common bile duct: Diameter: 6 mm Liver: Markedly limited assessment of hepatic parenchyma. Poor sonographic window and diffuse  edema in the setting of ascites limits assessment. Subtle area of nodularity along the margin of the liver on image 18 is hypoechoic relative to adjacent liver raising the question of a focal liver lesion measuring 2.8 x 2.6 cm. Liver is overall extremely heterogeneous. No visible flow in the portal vein both on color and power Doppler imaging. Other: Moderate volume ascites. IMPRESSION: Marked hepatic cirrhosis with findings that are most suggestive of portal venous occlusion without flow visible on color or power Doppler imaging. Potential focal hepatic lesion in the setting of cirrhosis. Findings could be seen in the setting of hepatocellular carcinoma. Exam limited by body habitus and poor sonographic window. For above findings would suggest multiphase CT with liver protocol for further evaluation, to guide further management. Ascites and anasarca a with gallbladder wall thickening likely related to liver disease and portal hypertension. A call is out to the referring provider to further discuss findings in the above case.  Electronically Signed: By: Zetta Bills [name] On: 03/01/2021 14:10    Assessment and plan-   #Multiple liver mass in cirrhotic liver, multiple lung nodules Suspect HCC.    AFP is elevated at 345. MRI abdomen with and without contrast showed infiltrative mass in the liver with extensive tumor thrombus involving all branches of the portal vein, extending from the main portal vein into right and the left portal vein branches.  Likely hepatocellular carcinoma with portal vein invasion, nodal and pulmonary metastasis.  Large collateral pathways in the upper abdomen and a large volume of ascites His case was presented on tumor board on 03/07/2021 Even though LI RAD 5 [definite HCC] can not be assigned, tumor board consensus reached upon on that this is highly likely [place].  Radiologists Dr.Wile and Dr. Shuttle on tumor board both feel biopsy of the liver is of extremely high risk and is not  recommended. Patient had child Pugh C liver cirrhosis, options are limited. Consensus reached on supportive care/ comfort measure be the best option for him. Discussed with patient today in person about tumor board recommendation.  He agrees with the plan. Yesterday, I have also discussed with his sister Butch Penny and updated her the recommendation. She verbalized understanding and agrees with the plan.  Palliative care is on board. 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  Given unknown varices condition, thrombocytopenia, risk of anticoagulation overweighs the benefit.  I will hold off therapeutic anticoagulation at this point.  Thank you for allowing me to participate in the care of this patient.   Earlie Server, MD, PhD Hematology Oncology [facility] at [facility] Pager- [phone] 03/08/2021

## 2021-03-08 NOTE — Discharge Summary (Signed)
Physician Discharge Summary   Carl Rivas:[id number] DOB: Mar 18, 1956 DOA: 02/28/2021  PCP: Merryl Hacker, No  Admit date: 02/28/2021 Discharge date:  03/08/2021  Admitted From: home Disposition:  Home with hospice Discharging physician: Dwyane Dee, MD  Recommendations for Outpatient Follow-up:  1. Patient discharged home with hospice   Patient discharged to home in Discharge Condition: poor Risk of unplanned readmission score: Unplanned Admission- Pilot do not use: 14.16  CODE STATUS: DNR/DNI Diet recommendation:  Diet Orders (From admission, onward)    Start     Ordered   03/01/21 1745  Diet Heart Room service appropriate? Yes; Fluid consistency: Thin  Diet effective now       Question Answer Comment  Room service appropriate? Yes   Fluid consistency: Thin      03/01/21 [facility] Course:  Carl Guia Allisonis a 65 y.o.malewith PMH significant formorbid obesity, history of alcoholism, opioid use, cured hepatitis C, liver cirrhosis, thrombocytopenia, chronic anemia, hypertension, prolonged QT, lymphedema. Patient lives at home alone, his brother lives close by. Patient presented to the ED on 5/19 with ongoing generalized weakness and lower extremity edema, ongoing for a month. Also reported intermittent shortness of breath as well as leg cramping.  In the ED, hemodynamically stable, breathing on room air. Potassium low at 2.8, hemoglobin low at 9.2, platelet low but stable at 70. Urinalysis significant for nitrites, leukocytes, bacteria, hemoglobin.  Admitted to hospitalist service for further evaluation management  5/20, CT chest, abdomen and pelvis with significant findings as below: 1. Multiple large nodules are noted in the right lower lobe consistent with metastatic disease or primary malignancy. 2. Multiple ill-defined low densities in the hepatic parenchyma suggesting malignancy or metastatic disease. 3. Liver cirrhosis, portal hypertension,  moderate ascites 4. Large periumbilical hernia is noted.  5/21, MRI abdomen was obtained which showed 1. Infiltrative mass in the liver with extensive tumor thrombus involving all branches of the portal vein, extending from the main portal vein into RIGHT and LEFT portal venous branches. Likely hepatocellular carcinoma with portal venous invasion, nodal and pulmonary metastatic disease.  2. Associated with large collateral pathways in the upper abdomen and large volume ascites.  He was evaluated by oncology and his case was also discussed at tumor board.  This was considered to be a very highly likely hepatocellular carcinoma and recommendation is for supportive care and hospice.  It was considered too high risk for undergoing biopsy and this was communicated to the patient by oncology who was in agreement and understanding.  Family also similarly has been updated and made aware. Palliative care has been consulted during hospitalization and plan will be for discharging patient home with hospice in place.  Suspected HCC with metastasis -5/20, CT CAP with multiple nodules and densities in right lower lobe as well as hepatic parenchyma -5/21, MRI abdomen suggestive of hepatocellular carcinoma with portal venous invasion, nodal and pulmonary metastatic disease as well as extensive tumor thrombus involving all the branches of portal vein. -Oncologyconsult obtained with Dr. Tasia Catchings. Cytology of ascitic fluid negative for malignant cells.  -Case discussed by oncology during the tumor board.  General consensus remains that this is most likely [facility] but biopsy is considered too high risk.  Recommendations are for comfort care/hospice - Patient remains in agreement and family has been updated as well - Plan is for discharging home on 03/08/2021 with hospice  Portal vein thrombosis -With MRI findings as above, portal vein invasion by  hepatocellular carcinoma with extensive tumor thrombus involving all branches  of the liver. - not an anticoagulation candidate per oncology  Liver cirrhosis/portal hypertension/moderate ascites -Liver cirrhosis secondary to h/o alcoholism, IV drugs and hepatitis C (cured). Patient reports he is no longer using alcohol or drugs.  -Not on any medicines at home. Does not follow with GI as an outpatient -5/23 underwent paracentesis, 5 L of straw-colored ascitic fluid drained. -Currently has compression bandage on both lower extremities. -5/21, started on Lasix IV 40 mg daily. Continue the same. -Continue to monitor blood pressure, electrolytes and renal function  E. coli UTI -Urinalysis significant for nitrites, leukocytes, bacteria -Urine culture with more than 100,000 CFU per mL of E. Coli, pansensitive. -Completed Rocephin course during hospitalization  Hypokalemia/hypophosphatemia -Due to poor nutrition as well as IV Lasix. Replacement ordered  Intermittent bloody stool - FOBT positive Chronic anemia History of folic acid deficiency -Reports intermittent dark stool in the setting of liver cirrhosis.  -Baseline hemoglobin was 12 a year ago, presented with hemoglobin low at 9.2. Despite FOBT positive, hemoglobin remains stable so far.  Thrombocytopenia -Running low. Likely related to liver cirrhosis no active bleeding. Continue to monitor  Prolonged QTC -QTC is prolonged in the setting of hypokalemia. Continue to monitor potassium level. -Avoid QT prolonging medications  Hypoalbuminemia -Albumin level low at 2.1, probably related to liver cirrhosis.  Chronic incarcerated abdominal hernia -He was recently at [facility] for chronic abdominal pain and evaluation which included imaging showing a chronic incarcerated hernia. He was evaluated by general surgery which indicated no need for urgent intervention. Now likely not a candidate for surgery if needed but remains stable  Chronic wounds- POA -Patient has chronic cellulitis, poor hygiene of  bilateral lower extremities. There is an open wound on the left lower leg and back of his thigh. Discussed with wound care nurse. No need of surgical debridement. Local wound care recommended. - see WOC recommendations   Goals of care -Poor prognosis because of advanced liver cirrhosis and a new diagnosis of presumed hepatocellular carcinoma. Hospice service recommended. Palliative care consult obtained. Patient and family agreed to DNR status at this time.  -See above.  Plan will be for discharging patient home with hospice   Principal Diagnosis: Complicated UTI (urinary tract infection)  Discharge Diagnoses: Active Hospital Problems   Diagnosis Date Noted  . Complicated UTI (urinary tract infection) 03/01/2021  . Goals of care, counseling/discussion   . Swelling   . Lung mass   . Liver mass   . Hypokalemia 03/01/2021  . Anemia 03/01/2021  . Blood in stool 03/01/2021  . Lymphedema of both lower extremities 03/01/2021  . Pressure injury of skin 03/01/2021  . Folate deficiency 04/03/2020  . Hepatitis C virus infection cured after antiviral drug therapy 11/22/2018  . Prolonged Q-T interval on ECG 08/17/2018  . Secondary thrombocytopenia 08/17/2018  . Opiate abuse, episodic ([place]) 08/13/2012  . Cirrhosis of liver ([place]) 12/12/2011    Resolved Hospital Problems  No resolved problems to display.    Discharge Instructions    Discharge wound care:   Complete by: As directed    Wound care to left/right lower extremity: Clean with soap and water, rinse with saline and pat dry. Cover with xeroform gauze. Top with ABD pad and secure by wrapping from below toes to just below knees with kerlix roll gauze. Finish covering with ACE bandage.   For left buttocks wound: Clean with soap, water, and pat dry. Apply Gerhart's butt cream three times daily  or as needed for soiling.   Increase activity slowly   Complete by: As directed      Allergies as of 03/08/2021      Reactions    Penicillins Hives      Medication List    TAKE these medications   Gerhardt's butt cream Crea Apply 1 application topically 3 (three) times daily. Apply in a thin layer to Stage 3 pressure injury at lower left buttock after soap and water cleanse and pat dry.  Position off of supine position.   Oxycodone HCl 10 MG Tabs Take 1 tablet (10 mg total) by mouth every 4 (four) hours as needed for severe pain or breakthrough pain.   senna-docusate 8.6-50 MG tablet Commonly known as: Senokot-S Take 1 tablet by mouth 2 (two) times daily as needed for mild constipation or moderate constipation.            Discharge Care Instructions  (From admission, onward)         Start     Ordered   03/08/21 0000  Discharge wound care:       Comments: Wound care to left/right lower extremity: Clean with soap and water, rinse with saline and pat dry. Cover with xeroform gauze. Top with ABD pad and secure by wrapping from below toes to just below knees with kerlix roll gauze. Finish covering with ACE bandage.   For left buttocks wound: Clean with soap, water, and pat dry. Apply Gerhart's butt cream three times daily or as needed for soiling.   03/08/21 1225          Allergies  Allergen Reactions  . Penicillins Hives    Consultations: Oncology  Palliative care  Discharge Exam: BP (!) 150/73 (BP Location: Left Arm)   Pulse (!) 102   Temp 98 F (36.7 C) (Oral)   Resp 16   Ht 5\' 10"  (1.778 m)   Wt 119.1 kg   SpO2 96%   BMI 37.67 kg/m  General appearance: alert, cooperative and no distress Head: Normocephalic, without obvious abnormality, atraumatic Eyes: EOMI Lungs: clear to auscultation bilaterally Heart: regular rate and rhythm and S1, S2 normal Abdomen: Obese, lower middle hernia appreciated with mild firmness.  Tenderness to palpation noted in right upper quadrant.  Bowel sounds present Extremities: 2+ bilateral lower extremity pitting edema Skin: Chronic stasis changes noted in  bilateral lower extremities Neurologic: Grossly normal  The results of significant diagnostics from this hospitalization (including imaging, microbiology, ancillary and laboratory) are listed below for reference.   Microbiology: Recent Results (from the past 240 hour(s))  Urine culture     Status: Abnormal   Collection Time: 02/28/21  4:34 PM   Specimen: Urine, Random  Result Value Ref Range Status   Specimen Description   Final    URINE, RANDOM Performed at [facility], [address] [place], [place] [address]    Special Requests   Final    NONE Performed at [facility], [address] [place], [place] [address]    Culture >=100,000 COLONIES/mL ESCHERICHIA COLI (A)  Final   Report Status 03/03/2021 FINAL  Final   Organism ID, Bacteria ESCHERICHIA COLI (A)  Final      Susceptibility   Escherichia coli - MIC*    AMPICILLIN <=2 SENSITIVE Sensitive     CEFAZOLIN <=4 SENSITIVE Sensitive     CEFEPIME <=0.12 SENSITIVE Sensitive     CEFTRIAXONE <=0.25 SENSITIVE Sensitive     CIPROFLOXACIN <=0.25 SENSITIVE Sensitive  GENTAMICIN <=1 SENSITIVE Sensitive     IMIPENEM <=0.25 SENSITIVE Sensitive     NITROFURANTOIN <=16 SENSITIVE Sensitive     TRIMETH/SULFA <=20 SENSITIVE Sensitive     AMPICILLIN/SULBACTAM <=2 SENSITIVE Sensitive     PIP/TAZO <=4 SENSITIVE Sensitive     * >=100,000 COLONIES/mL ESCHERICHIA COLI  Resp Panel by RT-PCR (Flu A&B, Covid) Nasopharyngeal Swab     Status: None   Collection Time: 02/28/21 11:52 PM   Specimen: Nasopharyngeal Swab; Nasopharyngeal(NP) swabs in vial transport medium  Result Value Ref Range Status   SARS Coronavirus 2 by RT PCR NEGATIVE NEGATIVE Final    Comment: (NOTE) SARS-CoV-2 target nucleic acids are NOT DETECTED.  The SARS-CoV-2 RNA is generally detectable in upper respiratory specimens during the acute phase of infection. The lowest concentration of SARS-CoV-2 viral copies this assay can detect is 138 copies/mL. A  negative result does not preclude SARS-Cov-2 infection and should not be used as the sole basis for treatment or other patient management decisions. A negative result may occur with  improper specimen collection/handling, submission of specimen other than nasopharyngeal swab, presence of viral mutation(s) within the areas targeted by this assay, and inadequate number of viral copies(<138 copies/mL). A negative result must be combined with clinical observations, patient history, and epidemiological information. The expected result is Negative.  Fact Sheet for Patients:  EntrepreneurPulse.com.au  Fact Sheet for Healthcare Providers:  IncredibleEmployment.be  This test is no t yet approved or cleared by the [place] FDA and  has been authorized for detection and/or diagnosis of SARS-CoV-2 by FDA under an Emergency Use Authorization (EUA). This EUA will remain  in effect (meaning this test can be used) for the duration of the COVID-19 declaration under Section 564(b)(1) of the Act, 21 U.S.C.section 360bbb-3(b)(1), unless the authorization is terminated  or revoked sooner.       Influenza A by PCR NEGATIVE NEGATIVE Final   Influenza B by PCR NEGATIVE NEGATIVE Final    Comment: (NOTE) The Xpert Xpress SARS-CoV-2/FLU/RSV plus assay is intended as an aid in the diagnosis of influenza from Nasopharyngeal swab specimens and should not be used as a sole basis for treatment. Nasal washings and aspirates are unacceptable for Xpert Xpress SARS-CoV-2/FLU/RSV testing.  Fact Sheet for Patients: EntrepreneurPulse.com.au  Fact Sheet for Healthcare Providers: IncredibleEmployment.be  This test is not yet approved or cleared by the [place] FDA and has been authorized for detection and/or diagnosis of SARS-CoV-2 by FDA under an Emergency Use Authorization (EUA). This EUA will remain in effect (meaning this test can  be used) for the duration of the COVID-19 declaration under Section 564(b)(1) of the Act, 21 U.S.C. section 360bbb-3(b)(1), unless the authorization is terminated or revoked.  Performed at [facility], [address] [place], [place] [address]   Body fluid culture w Gram Stain     Status: None   Collection Time: 03/04/21 10:24 AM   Specimen: PATH Cytology Peritoneal fluid  Result Value Ref Range Status   Specimen Description   Final    PERITONEAL Performed at [facility], [address], [place], [place] [address]    Special Requests   Final    NONE Performed at [facility], [address] [place], [place] [address]    Gram Stain   Final    WBC PRESENT,BOTH PMN AND MONONUCLEAR NO ORGANISMS SEEN CYTOSPIN SMEAR    Culture   Final    NO GROWTH 3 DAYS Performed at [facility], [address] [place], [place] [address]    Report Status 03/07/2021 FINAL  Final     Labs: BNP (last 3 results) No results for input(s): BNP in the last 8760 hours. Basic Metabolic Panel: Recent Labs  Lab 03/02/21 0521 03/03/21 0449 03/04/21 0413 03/06/21 0524 03/07/21 0534  NA 137 136 136 134* 134*  K 3.5 2.8* 3.8 3.8 3.4*  CL 108 107 108 101 100  CO2 25 24 24 26 26   GLUCOSE 94 124* 115* 95 101*  BUN 11 13 12 18 21   CREATININE 0.70 0.65 0.65 0.68 0.72  CALCIUM 7.6* 7.7* 7.7* 8.0* 8.1*  MG 1.8  --  2.0 2.0 1.9  PHOS 2.7  --  2.2* 2.8  --    Liver Function Tests: Recent Labs  Lab 03/02/21 0521 03/03/21 0449 03/04/21 0413 03/07/21 0534  AST 101* 116* 122* 145*  ALT 19 20 21 24   ALKPHOS 101 107 105 121  BILITOT 3.8* 3.1* 3.2* 3.7*  PROT 6.1* 6.4* 6.4* 6.4*  ALBUMIN 2.0* 2.1* 2.1* 2.0*   No results for input(s): LIPASE, AMYLASE in the last 168 hours. Recent Labs  Lab 03/02/21 0521  AMMONIA 21   CBC: Recent Labs  Lab 03/02/21 0521 03/03/21 0449 03/04/21 0413 03/06/21 0524 03/07/21 0534  WBC 3.6* 4.1 4.6 5.6 5.8  NEUTROABS 2.3  2.6 2.9 3.7 4.0  HGB 8.8* 9.5* 9.9* 10.4* 9.9*  HCT 26.2* 28.7* 28.9* 31.3* 29.1*  MCV 102.3* 101.4* 100.7* 101.6* 101.0*  PLT 65* 80* 81* 80* 82*   Cardiac Enzymes: No results for input(s): CKTOTAL, CKMB, CKMBINDEX, TROPONINI in the last 168 hours. BNP: Invalid input(s): POCBNP CBG: No results for input(s): GLUCAP in the last 168 hours. D-Dimer No results for input(s): DDIMER in the last 72 hours. Hgb A1c No results for input(s): HGBA1C in the last 72 hours. Lipid Profile No results for input(s): CHOL, HDL, LDLCALC, TRIG, CHOLHDL, LDLDIRECT in the last 72 hours. Thyroid function studies No results for input(s): TSH, T4TOTAL, T3FREE, THYROIDAB in the last 72 hours.  Invalid input(s): FREET3 Anemia work up No results for input(s): VITAMINB12, FOLATE, FERRITIN, TIBC, IRON, RETICCTPCT in the last 72 hours. Urinalysis    Component Value Date/Time   COLORURINE AMBER (A) 02/28/2021 1634   APPEARANCEUR HAZY (A) 02/28/2021 1634   LABSPEC 1.021 02/28/2021 1634   PHURINE 5.0 02/28/2021 1634   GLUCOSEU NEGATIVE 02/28/2021 1634   HGBUR MODERATE (A) 02/28/2021 1634   BILIRUBINUR NEGATIVE 02/28/2021 1634   KETONESUR NEGATIVE 02/28/2021 1634   PROTEINUR 30 (A) 02/28/2021 1634   NITRITE POSITIVE (A) 02/28/2021 1634   LEUKOCYTESUR TRACE (A) 02/28/2021 1634   Sepsis Labs Invalid input(s): PROCALCITONIN,  WBC,  LACTICIDVEN Microbiology Recent Results (from the past 240 hour(s))  Urine culture     Status: Abnormal   Collection Time: 02/28/21  4:34 PM   Specimen: Urine, Random  Result Value Ref Range Status   Specimen Description   Final    URINE, RANDOM Performed at [facility], [address] [place], [place] [address]    Special Requests   Final    NONE Performed at [facility], [address] [place],  [address]    Culture >=100,000 COLONIES/mL ESCHERICHIA COLI (A)  Final   Report Status 03/03/2021 FINAL  Final   Organism ID, Bacteria ESCHERICHIA  COLI (A)  Final      Susceptibility   Escherichia coli - MIC*    AMPICILLIN <=2 SENSITIVE Sensitive     CEFAZOLIN <=  4 SENSITIVE Sensitive     CEFEPIME <=0.12 SENSITIVE Sensitive     CEFTRIAXONE <=0.25 SENSITIVE Sensitive     CIPROFLOXACIN <=0.25 SENSITIVE Sensitive     GENTAMICIN <=1 SENSITIVE Sensitive     IMIPENEM <=0.25 SENSITIVE Sensitive     NITROFURANTOIN <=16 SENSITIVE Sensitive     TRIMETH/SULFA <=20 SENSITIVE Sensitive     AMPICILLIN/SULBACTAM <=2 SENSITIVE Sensitive     PIP/TAZO <=4 SENSITIVE Sensitive     * >=100,000 COLONIES/mL ESCHERICHIA COLI  Resp Panel by RT-PCR (Flu A&B, Covid) Nasopharyngeal Swab     Status: None   Collection Time: 02/28/21 11:52 PM   Specimen: Nasopharyngeal Swab; Nasopharyngeal(NP) swabs in vial transport medium  Result Value Ref Range Status   SARS Coronavirus 2 by RT PCR NEGATIVE NEGATIVE Final    Comment: (NOTE) SARS-CoV-2 target nucleic acids are NOT DETECTED.  The SARS-CoV-2 RNA is generally detectable in upper respiratory specimens during the acute phase of infection. The lowest concentration of SARS-CoV-2 viral copies this assay can detect is 138 copies/mL. A negative result does not preclude SARS-Cov-2 infection and should not be used as the sole basis for treatment or other patient management decisions. A negative result may occur with  improper specimen collection/handling, submission of specimen other than nasopharyngeal swab, presence of viral mutation(s) within the areas targeted by this assay, and inadequate number of viral copies(<138 copies/mL). A negative result must be combined with clinical observations, patient history, and epidemiological information. The expected result is Negative.  Fact Sheet for Patients:  EntrepreneurPulse.com.au  Fact Sheet for Healthcare Providers:  IncredibleEmployment.be  This test is no t yet approved or cleared by the [place] FDA and  has been  authorized for detection and/or diagnosis of SARS-CoV-2 by FDA under an Emergency Use Authorization (EUA). This EUA will remain  in effect (meaning this test can be used) for the duration of the COVID-19 declaration under Section 564(b)(1) of the Act, 21 U.S.C.section 360bbb-3(b)(1), unless the authorization is terminated  or revoked sooner.       Influenza A by PCR NEGATIVE NEGATIVE Final   Influenza B by PCR NEGATIVE NEGATIVE Final    Comment: (NOTE) The Xpert Xpress SARS-CoV-2/FLU/RSV plus assay is intended as an aid in the diagnosis of influenza from Nasopharyngeal swab specimens and should not be used as a sole basis for treatment. Nasal washings and aspirates are unacceptable for Xpert Xpress SARS-CoV-2/FLU/RSV testing.  Fact Sheet for Patients: EntrepreneurPulse.com.au  Fact Sheet for Healthcare Providers: IncredibleEmployment.be  This test is not yet approved or cleared by the [place] FDA and has been authorized for detection and/or diagnosis of SARS-CoV-2 by FDA under an Emergency Use Authorization (EUA). This EUA will remain in effect (meaning this test can be used) for the duration of the COVID-19 declaration under Section 564(b)(1) of the Act, 21 U.S.C. section 360bbb-3(b)(1), unless the authorization is terminated or revoked.  Performed at [facility], [address] [place], [place] [address]   Body fluid culture w Gram Stain     Status: None   Collection Time: 03/04/21 10:24 AM   Specimen: PATH Cytology Peritoneal fluid  Result Value Ref Range Status   Specimen Description   Final    PERITONEAL Performed at [facility], [address], [place], [place] [address]    Special Requests   Final    NONE Performed at [facility], [address], [place], [place] [address]    Gram Stain   Final    WBC  PRESENT,BOTH PMN AND MONONUCLEAR NO ORGANISMS SEEN CYTOSPIN SMEAR    Culture   Final     NO GROWTH 3 DAYS Performed at [facility], [address], [place], [place] [address]    Report Status 03/07/2021 FINAL  Final    Procedures/Studies: CT ABDOMEN WO CONTRAST  Result Date: 03/01/2021 CLINICAL DATA:  Hepatic cirrhosis.  Anemia. EXAM: CT CHEST, ABDOMEN AND PELVIS WITHOUT CONTRAST TECHNIQUE: Multidetector CT imaging of the chest and abdomen was performed following the standard protocol without IV contrast. COMPARISON:  None. FINDINGS: CT CHEST FINDINGS Cardiovascular: Atherosclerosis of thoracic aorta is noted without aneurysm formation. Coronary calcifications are noted. Normal cardiac size. No pericardial effusion. Mediastinum/Nodes: No enlarged mediastinal, hilar, or axillary lymph nodes. Thyroid gland, trachea, and esophagus demonstrate no significant findings. Lungs/Pleura: No pneumothorax or pleural effusion is noted. Multiple large nodules are noted in the right lower lobe concerning for malignancy and metastatic disease. Largest nodule measures 2.7 x 2.2 cm. 7 mm nodule is noted in left upper lobe best seen on image number 35 of series 4. Musculoskeletal: No chest wall mass or suspicious bone lesions identified. CT ABDOMEN FINDINGS Hepatobiliary: Nodular hepatic contours are noted consistent with hepatic cirrhosis. Moderate amount of ascites is noted around the liver. Cholelithiasis is noted. No biliary dilatation is noted. Ill-defined low densities are noted throughout the hepatic parenchyma suggesting possible malignancy or metastatic disease. Pancreas: Unremarkable. No pancreatic ductal dilatation or surrounding inflammatory changes. Spleen: Moderate splenomegaly is noted. Adrenals/Urinary Tract: Adrenal glands and kidneys are unremarkable. Stomach/Bowel: The stomach and visualized bowel loops are unremarkable. Vascular/Lymphatic: Atherosclerosis of abdominal aorta is noted without aneurysm formation. Collateral veins are noted suggesting portal hypertension. No definite  significant adenopathy is noted. Other: Moderate ascites is noted around the liver and spleen. Large periumbilical hernia is noted. Musculoskeletal: No acute or significant osseous findings. IMPRESSION: Multiple large nodules are noted in the right lower lobe consistent with metastatic disease or primary malignancy. Findings consistent with hepatic cirrhosis. There appears to be multiple ill-defined low densities in the hepatic parenchyma suggesting malignancy or metastatic disease. Further evaluation with MRI is recommended. Moderate splenomegaly is noted with collateral veins present suggesting portal hypertension. Moderate ascites is noted around the liver and spleen. Large periumbilical hernia is noted. Coronary artery calcifications are noted suggesting coronary artery disease. Aortic Atherosclerosis (ICD10-I70.0). Electronically Signed   By: Marijo Conception [name]   On: 03/01/2021 17:25   CT CHEST WO CONTRAST  Result Date: 03/01/2021 CLINICAL DATA:  Hepatic cirrhosis.  Anemia. EXAM: CT CHEST, ABDOMEN AND PELVIS WITHOUT CONTRAST TECHNIQUE: Multidetector CT imaging of the chest and abdomen was performed following the standard protocol without IV contrast. COMPARISON:  None. FINDINGS: CT CHEST FINDINGS Cardiovascular: Atherosclerosis of thoracic aorta is noted without aneurysm formation. Coronary calcifications are noted. Normal cardiac size. No pericardial effusion. Mediastinum/Nodes: No enlarged mediastinal, hilar, or axillary lymph nodes. Thyroid gland, trachea, and esophagus demonstrate no significant findings. Lungs/Pleura: No pneumothorax or pleural effusion is noted. Multiple large nodules are noted in the right lower lobe concerning for malignancy and metastatic disease. Largest nodule measures 2.7 x 2.2 cm. 7 mm nodule is noted in left upper lobe best seen on image number 35 of series 4. Musculoskeletal: No chest wall mass or suspicious bone lesions identified. CT ABDOMEN FINDINGS Hepatobiliary:  Nodular hepatic contours are noted consistent with hepatic cirrhosis. Moderate amount of ascites is noted around the liver. Cholelithiasis is noted. No biliary dilatation is noted. Ill-defined low densities are noted throughout  the hepatic parenchyma suggesting possible malignancy or metastatic disease. Pancreas: Unremarkable. No pancreatic ductal dilatation or surrounding inflammatory changes. Spleen: Moderate splenomegaly is noted. Adrenals/Urinary Tract: Adrenal glands and kidneys are unremarkable. Stomach/Bowel: The stomach and visualized bowel loops are unremarkable. Vascular/Lymphatic: Atherosclerosis of abdominal aorta is noted without aneurysm formation. Collateral veins are noted suggesting portal hypertension. No definite significant adenopathy is noted. Other: Moderate ascites is noted around the liver and spleen. Large periumbilical hernia is noted. Musculoskeletal: No acute or significant osseous findings. IMPRESSION: Multiple large nodules are noted in the right lower lobe consistent with metastatic disease or primary malignancy. Findings consistent with hepatic cirrhosis. There appears to be multiple ill-defined low densities in the hepatic parenchyma suggesting malignancy or metastatic disease. Further evaluation with MRI is recommended. Moderate splenomegaly is noted with collateral veins present suggesting portal hypertension. Moderate ascites is noted around the liver and spleen. Large periumbilical hernia is noted. Coronary artery calcifications are noted suggesting coronary artery disease. Aortic Atherosclerosis (ICD10-I70.0). Electronically Signed   By: Marijo Conception [name]   On: 03/01/2021 17:25   MR ABDOMEN W WO CONTRAST  Addendum Date: 03/05/2021   ADDENDUM REPORT: 03/05/2021 21:15 ADDENDUM: Imaging findings as reported by strict LI-RADS definition, categorized as "LR-TIV, probably due to hepatocellular carcinoma" . Given the history of hepatitis C, history of liver disease as well as  elevated AFP, findings are compatible with [place] as outlined in the initial report. Electronically Signed   By: Zetta Bills [name]   On: 03/05/2021 21:15   Result Date: 03/05/2021 CLINICAL DATA:  Hepatic cirrhosis, suspected malignancy. Question of mass seen on CT imaging of the chest. EXAM: MRI ABDOMEN WITHOUT AND WITH CONTRAST TECHNIQUE: Multiplanar multisequence MR imaging of the abdomen was performed both before and after the administration of intravenous contrast. CONTRAST:  25mL GADAVIST GADOBUTROL 1 MMOL/ML IV SOLN COMPARISON:  CT of the chest and CT of the abdomen from Mar 01, 2021. FINDINGS: Lower chest: Nodules and masses at the RIGHT lung base better seen on recent chest CT. Hepatobiliary: Marked hepatic cirrhosis. Expansile portal venous invasion in diffuse signal abnormality in the central liver. Areas lack frank arterial phase enhancement, unclear whether this could be an issue of bolus timing based on images presented. Signal is present in vascular structures and there is apparent enhancement of viscera on the first of the T1 weighted images. Diffusion weighted abnormality measuring as much as 16 by 9 cm in the central liver tracking through areas of both LEFT and RIGHT hepatic lobe as well as the caudate. Expansile portal venous thrombus with slight increase in signal intensity from 113 signal intensity units to approximately 150 signal intensity units over the course of the study. Discrete mass from which this process arises is not visualized. More hypointense area seen on image 23 of series 11 in the posterior RIGHT liver with characteristics of washout measuring 10 x 6 cm and contiguous with the caudate lobe and other areas of branching low signal in the portal venous system is compatible more likely with the primary tumor. Smaller satellite lesions are present as well, for instance in the anterior LEFT hepatic lobe (image 21/11) 13 mm area. Main portal vein with increased signal intensity from  pre to postcontrast as well. The SMV shows flow based on enhancement into the splenic portal confluence. There are large collateral pathways in the upper abdomen with gastric and esophageal varices. Large lymph node at the celiac axis measuring up to 2.5 cm also with restricted diffusion. Pancreas:  No focal pancreatic abnormality Spleen:  Splenomegaly.  No focal lesion. Adrenals/Urinary Tract: Adrenal glands are normal. Symmetric renal enhancement. Small cysts in the bilateral kidneys. Stomach/Bowel: Generalized bowel edema in the setting of ascites and portal hypertension. No acute findings to the extent evaluated on abdominal MRI. 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 Necrotic appearing celiac node. Other:  Large volume ascites. Musculoskeletal: No suspicious bone lesions identified. IMPRESSION: 1. Infiltrative mass in the liver with extensive tumor thrombus involving all branches of the portal vein, extending from the main portal vein into RIGHT and LEFT portal venous branches. Likely hepatocellular carcinoma with portal venous invasion, nodal and pulmonary metastatic disease. Correlation with alpha fetoprotein levels is suggested. 2. Associated with large collateral pathways in the upper abdomen and large volume ascites. Electronically Signed: By: Zetta Bills [name] On: 03/03/2021 10:25   US Paracentesis  Result Date: 03/04/2021 INDICATION: Patient with history of alcoholism, cured hepatitis C, liver cirrhosis. Presented to the ED with lower extremity edema and generalized weakness. Found to have ascites. Request is for therapeutic and diagnostic paracentesis EXAM: ULTRASOUND GUIDED THERAPEUTIC AND DIAGNOSTIC PARACENTESIS MEDICATIONS: Lidocaine 1% 10 mL COMPLICATIONS: None immediate. PROCEDURE: Informed written consent was obtained from the patient after a discussion of the risks, benefits and alternatives to treatment. A timeout was performed prior to the initiation of the  procedure. Initial ultrasound scanning demonstrates a moderate amount of ascites within the right lower abdominal quadrant. The right lower abdomen was prepped and draped in the usual sterile fashion. 1% lidocaine was used for local anesthesia. Following this, a 19 gauge, 10-cm, Yueh catheter was introduced. An ultrasound image was saved for documentation purposes. The paracentesis was performed. The catheter was removed and a dressing was applied. The patient tolerated the procedure well without immediate post procedural complication. Patient received post-procedure intravenous albumin; see nursing notes for details. FINDINGS: A total of approximately 5 L of straw-colored fluid was removed. Samples were sent to the laboratory as requested by the clinical team. IMPRESSION: Successful ultrasound-guided therapeutic and diagnostic paracentesis yielding 5 liters of peritoneal fluid. Read by: Rushie Nyhan, NP Electronically Signed   By: Markus Daft [name]   On: 03/04/2021 11:20   US Abdomen Limited RUQ (LIVER/GB)  Addendum Date: 03/01/2021   ADDENDUM REPORT: 03/01/2021 14:14 ADDENDUM: These results were called by telephone at the time of interpretation on 03/01/2021 at 2:14 pm to provider [facility] , who verbally acknowledged these results. Electronically Signed   By: Zetta Bills [name]   On: 03/01/2021 14:14   Result Date: 03/01/2021 CLINICAL DATA:  Cirrhosis, anasarca. EXAM: ULTRASOUND ABDOMEN LIMITED RIGHT UPPER QUADRANT COMPARISON:  Abdominal sonogram from 2007. FINDINGS: Gallbladder: Marked gallbladder wall thickening in the presence of ascites and marked liver disease. No reported tenderness over the gallbladder. Gallbladder wall thickness in excess of 7 mm. Common bile duct: Diameter: 6 mm Liver: Markedly limited assessment of hepatic parenchyma. Poor sonographic window and diffuse edema in the setting of ascites limits assessment. Subtle area of nodularity along the margin of the liver on image 18 is  hypoechoic relative to adjacent liver raising the question of a focal liver lesion measuring 2.8 x 2.6 cm. Liver is overall extremely heterogeneous. No visible flow in the portal vein both on color and power Doppler imaging. Other: Moderate volume ascites. IMPRESSION: Marked hepatic cirrhosis with findings that are most suggestive of portal venous occlusion without flow visible on color or power Doppler imaging. Potential focal hepatic lesion in the setting of cirrhosis.  Findings could be seen in the setting of hepatocellular carcinoma. Exam limited by body habitus and poor sonographic window. For above findings would suggest multiphase CT with liver protocol for further evaluation, to guide further management. Ascites and anasarca a with gallbladder wall thickening likely related to liver disease and portal hypertension. A call is out to the referring provider to further discuss findings in the above case. Electronically Signed: By: Zetta Bills [name] On: 03/01/2021 14:10     Time coordinating discharge: Over 30 minutes    Dwyane Dee, MD  [facility] 03/08/2021, 3:51 PM

## 2021-03-08 NOTE — TOC Progression Note (Signed)
Transition of Care [facility]) - Progression Note    Patient Details  Name: Carl Rivas MRN: [id number] Date of Birth: 06/08/1956  Transition of Care [facility]) CM/SW Contact  Shelbie Hutching, RN Phone Number: 03/08/2021, 4:03 PM  Clinical Narrative:    Hospital bed and other equipment is being delivered now- RNCM called EMS to have them come sooner if able- patient is 2nd on the list for pick up.    Expected Discharge Plan: Home w Hospice Care Barriers to Discharge: Barriers Resolved  Expected Discharge Plan and Services Expected Discharge Plan: [place]   Discharge Planning Services: CM Consult Post Acute Care Choice: Hospice Living arrangements for the past 2 months: Mobile Home Expected Discharge Date: 03/08/21               DME Arranged: Hospital bed,Wheelchair manual DME Agency: [facility] [place] ([place]) Date DME Agency Contacted: 03/08/21 Time DME Agency Contacted: 1130 Representative spoke with at DME Agency: Anderson Malta HH Arranged: NA HH Agency: NA         Social Determinants of Health ([place]) Interventions    Readmission Risk Interventions No flowsheet data found.

## 2021-03-08 NOTE — Progress Notes (Signed)
Pt discharged home via EMS-no distress noted

## 2021-03-08 NOTE — Progress Notes (Signed)
    Chart Reviewed. Patient assessed. Updates received.   Carl Rivas sitting up in bed resting. No acute distress noted. He states he does not feel well today but is glad to be able to get back home soon and be with his family. He is thankful of the support of his brother Carl Rivas.   We discussed care at discharge and continued outpatient support with hospice. Carl Rivas verbalized understanding and expresses his fear of suffering during his last moments. Emotional support provided. He is aware he will have medications for comfort and hospice will do a great job educating him and family on how to manage his symptoms in the home as they arise. He verbalized understanding and appreciation.   Carl Rivas, updated. He has spoken to the Case Manager and Hospice, RN. He confirms wishes for Carl Rivas to return home with family support. He and his sister, Carl Rivas are planning to remain at patient's side in the home.   He verbalizes his understanding of hospice's goals and philosophy of care. He is appreciative of the care provided to Carl Rivas during his hospital stay.   All questions answered and support provided.   Assessment -NAD, frail, ill-appearing -AAO x3, mood appropriate  Plan -D/C home with family and outpatient hospice support as requested and confirmed by patient and his brother, Carl Rivas.  -TOC aware and arranging home equipment and transportation in collaboration with Anderson Malta, RN ([place]).  -Patient will need comfort care medications at discharge (Oxy IR and ativan).  -DNR on file  Time Total: 45 min.   Visit consisted of counseling and education dealing with the complex and emotionally intense issues of symptom management and palliative care in the setting of serious and potentially life-threatening illness.Greater than 50%  of this time was spent counseling and coordinating care related to the above assessment and plan.  Alda Lea, AGPCNP-BC  Palliative Medicine  Team [phone]

## 2021-03-08 NOTE — Progress Notes (Addendum)
[facility])  Referral received for hospice services at home.  Met with patient in the room to discuss hospice. He does not maintain eye contact or readily engage but is agreeable to hospice. Spoke with his sister Carl Rivas, message left for brother Carl Rivas.  Plan is to dc home with Carl Rivas confirms this plan. TOC manager was able to connect with Carl Rivas and confirms as well.  DME needed: hospital bed, bedside table, wheelchair--[facility] will order and request that it be delivered today.  Please send completed DNR home with patient.  If any comfort medications are anticipated, please arrange for this so there is no lapse in his comfort needs prior to hospice meeting him in the home (likely Saturday or Sunday).  Thank you, Carl Carbon RN, BSN, [facility] Liaison   **DME set to be delivered by 430 pm

## 2021-03-08 NOTE — TOC Progression Note (Signed)
Transition of Care [facility]) - Progression Note    Patient Details  Name: Carl VANHOOK MRN: [id number] Date of Birth: 1956-09-02  Transition of Care [facility]) CM/SW Contact  Shelbie Hutching, RN Phone Number: 03/08/2021, 10:52 AM  Clinical Narrative:    Palliative care team is following patient while in the hospital.  Patient seems to be on board with home hospice care but wants to make sure his siblings are on board.  RNCM reached out to brother, [name], and left a voicemail for return call today about discharge planning.     Expected Discharge Plan: Home w Hospice Care Barriers to Discharge: Continued Medical Work up  Expected Discharge Plan and Services Expected Discharge Plan: [place]   Discharge Planning Services: CM Consult Post Acute Care Choice: Hospice Living arrangements for the past 2 months: Mobile Home                 DME Arranged: N/A DME Agency: NA       HH Arranged: NA HH Agency: NA         Social Determinants of Health (SDOH) Interventions    Readmission Risk Interventions No flowsheet data found.

## 2021-03-08 NOTE — TOC Progression Note (Signed)
Transition of Care [facility]) - Progression Note    Patient Details  Name: DOYLE KUNATH MRN: [id number] Date of Birth: 1956-04-14  Transition of Care [facility]) CM/SW Contact  Shelbie Hutching, RN Phone Number: 03/08/2021, 3:27 PM  Clinical Narrative:    [facility] has been arranged for after 5:30 pm- hospice equipment should be delivered before 6pm.     Expected Discharge Plan: [place] Barriers to Discharge: Barriers Resolved  Expected Discharge Plan and Services Expected Discharge Plan: [place]   Discharge Planning Services: CM Consult Post Acute Care Choice: Hospice Living arrangements for the past 2 months: Mobile Home Expected Discharge Date: 03/08/21               DME Arranged: Hospital bed,Wheelchair manual DME Agency: [facility] [place] ([place]) Date DME Agency Contacted: 03/08/21 Time DME Agency Contacted: 1130 Representative spoke with at DME Agency: Anderson Malta HH Arranged: NA [place] Agency: NA         Social Determinants of Health ([place]) Interventions    Readmission Risk Interventions No flowsheet data found.

## 2021-04-12 DEATH — deceased

## 2021-09-22 IMAGING — US US ABDOMEN LIMITED
2 series · 12 of 25 positions shown · non-contrast
Comparison: Abdominal sonogram from 8994.
COMPARISON: Abdominal sonogram from 8994.

Addendum:
CLINICAL DATA: Cirrhosis, anasarca.

EXAM:
ULTRASOUND ABDOMEN LIMITED RIGHT UPPER QUADRANT

[Series 1: us abdomen limited ruq (liver/gb) · 11 of 40 slices shown]
[im 2/40]
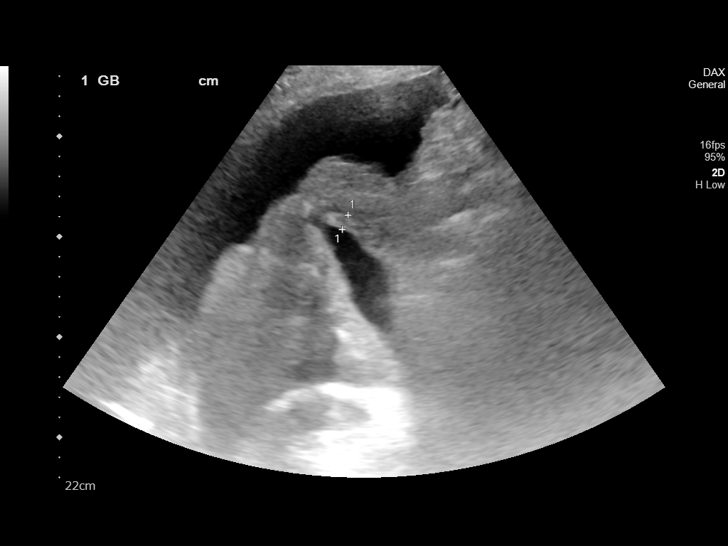
[im 6/40]
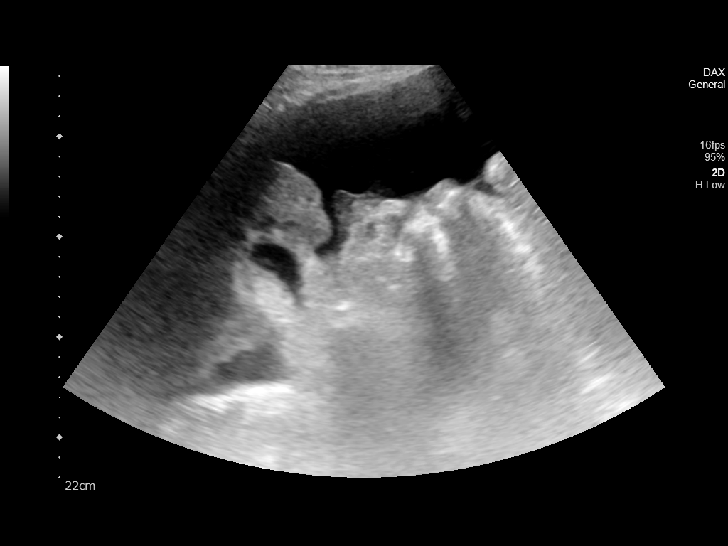
[im 9/40]
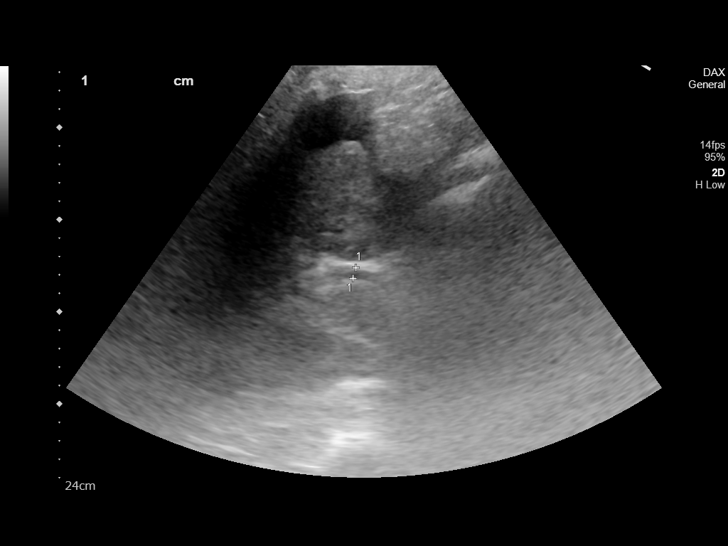
[im 13/40]
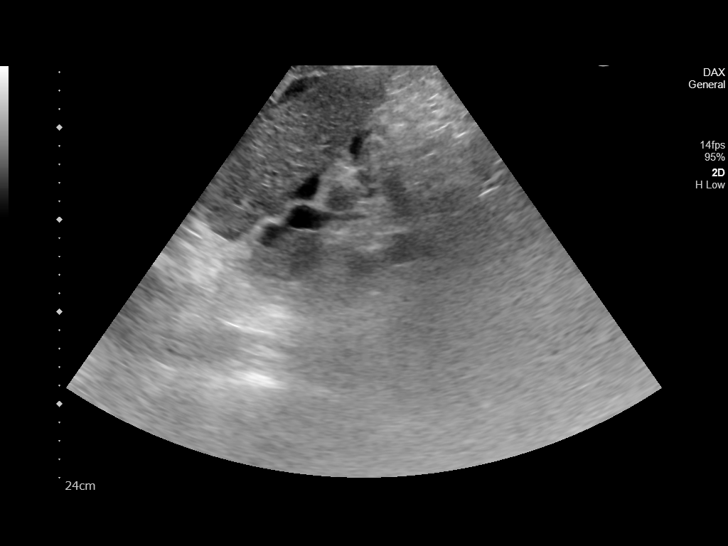
[im 16/40]
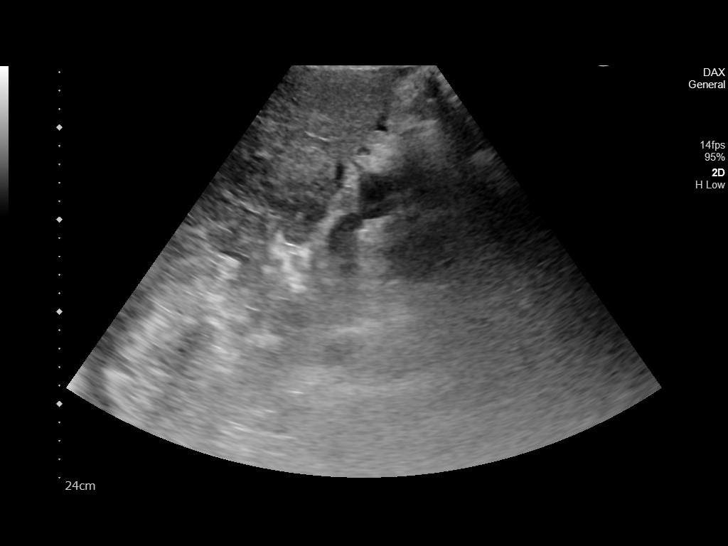
[im 20/40]
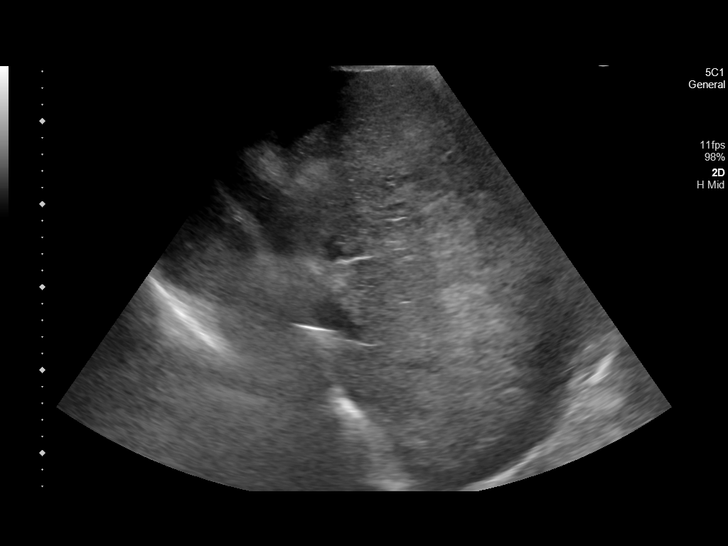
[im 24/40]
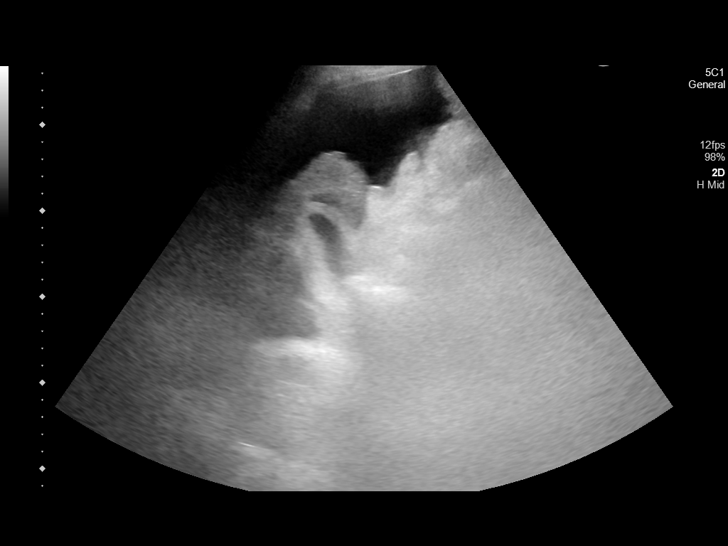
[im 27/40]
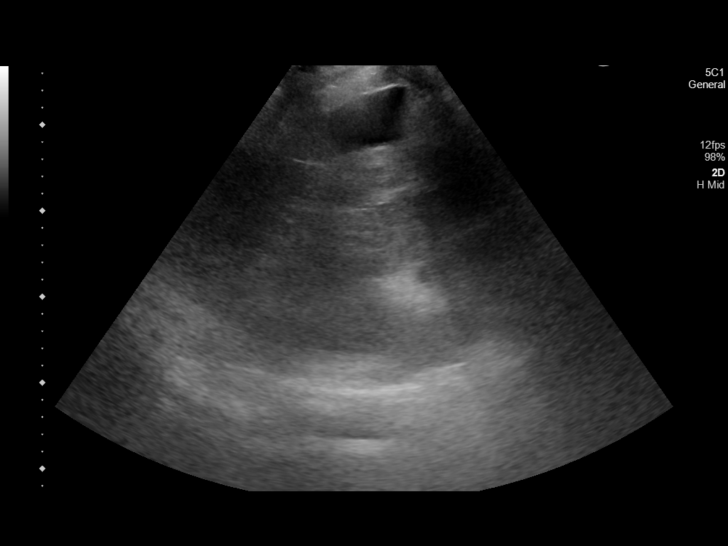
[im 31/40]
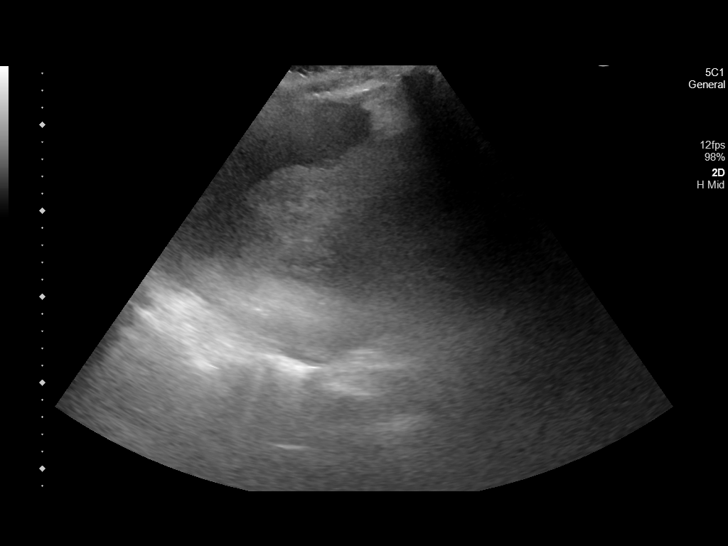
[im 34/40]
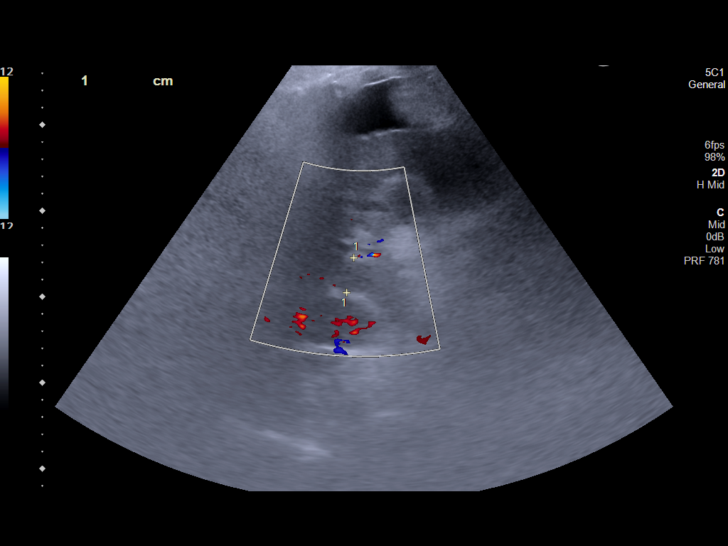
[im 38/40]
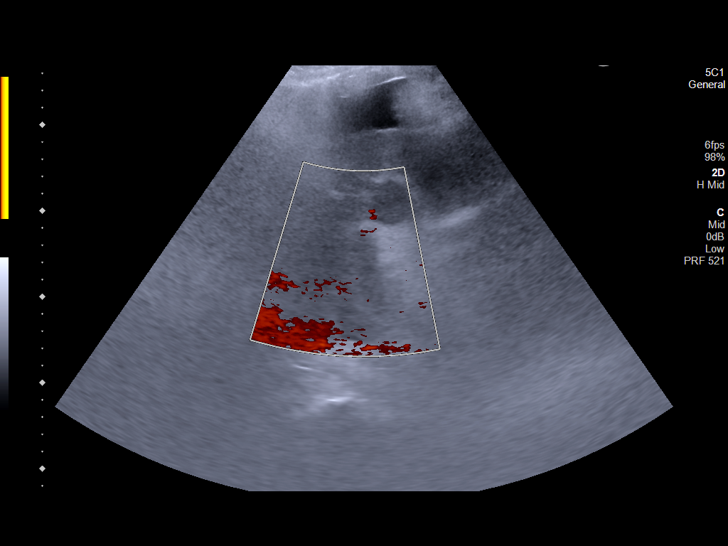

[Series 1001: general · 1 of 3 slices shown]
[im 1/3]
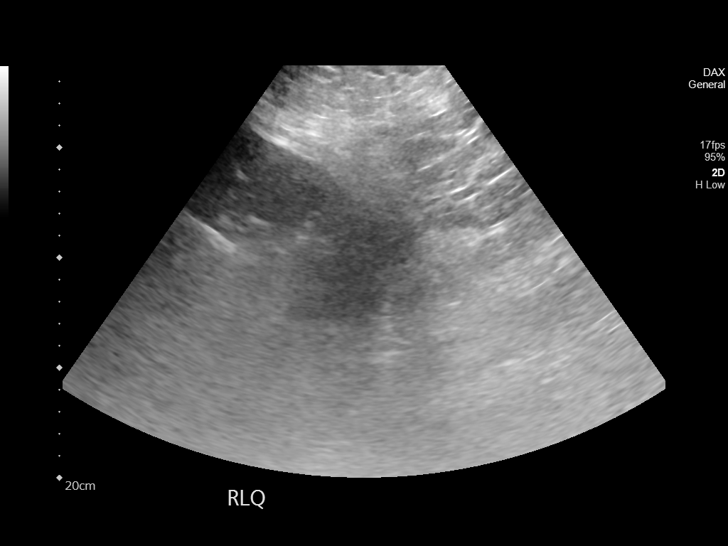

[12 of 25 positions shown; findings below may reference images not displayed]

FINDINGS: Gallbladder:

Marked gallbladder wall thickening in the presence of ascites and
marked liver disease. No reported tenderness over the gallbladder.
Gallbladder wall thickness in excess of 7 mm.

Common bile duct:

Diameter: 6 mm

Liver:

Markedly limited assessment of hepatic parenchyma. Poor sonographic
window and diffuse edema in the setting of ascites limits
assessment. Subtle area of nodularity along the margin of the liver
on image 18 is hypoechoic relative to adjacent liver raising the
question of a focal liver lesion measuring 2.8 x 2.6 cm. Liver is
overall extremely heterogeneous. No visible flow in the portal vein
both on color and power Doppler imaging.

Other: Moderate volume ascites.
IMPRESSION: Marked hepatic cirrhosis with findings that are most suggestive of
portal venous occlusion without flow visible on color or power
Doppler imaging.

Potential focal hepatic lesion in the setting of cirrhosis. Findings
could be seen in the setting of hepatocellular carcinoma. Exam
limited by body habitus and poor sonographic window.

For above findings would suggest multiphase CT with liver protocol
for further evaluation, to guide further management.

Ascites and anasarca a with gallbladder wall thickening likely
related to liver disease and portal hypertension.

A call is out to the referring provider to further discuss findings
in the above case.

ADDENDUM:
These results were called by telephone at the time of interpretation
on 03/01/2021 at [DATE] to provider NOMASIBULELE MOATSHE , who verbally
acknowledged these results.

*** End of Addendum ***
FINDINGS: Gallbladder:

Marked gallbladder wall thickening in the presence of ascites and
marked liver disease. No reported tenderness over the gallbladder.
Gallbladder wall thickness in excess of 7 mm.

Common bile duct:

Diameter: 6 mm

Liver:

Markedly limited assessment of hepatic parenchyma. Poor sonographic
window and diffuse edema in the setting of ascites limits
assessment. Subtle area of nodularity along the margin of the liver
on image 18 is hypoechoic relative to adjacent liver raising the
question of a focal liver lesion measuring 2.8 x 2.6 cm. Liver is
overall extremely heterogeneous. No visible flow in the portal vein
both on color and power Doppler imaging.

Other: Moderate volume ascites.
IMPRESSION: Marked hepatic cirrhosis with findings that are most suggestive of
portal venous occlusion without flow visible on color or power
Doppler imaging.

Potential focal hepatic lesion in the setting of cirrhosis. Findings
could be seen in the setting of hepatocellular carcinoma. Exam
limited by body habitus and poor sonographic window.

For above findings would suggest multiphase CT with liver protocol
for further evaluation, to guide further management.

Ascites and anasarca a with gallbladder wall thickening likely
related to liver disease and portal hypertension.

A call is out to the referring provider to further discuss findings
in the above case.

## 2021-09-25 IMAGING — US US PARACENTESIS
1 series · 5 of 5 positions shown · non-contrast
Comparison: none

INDICATION: Patient with history of alcoholism, cured hepatitis C, liver
cirrhosis. Presented to the ED with lower extremity edema and
generalized weakness. Found to have ascites. Request is for
therapeutic and diagnostic paracentesis

[Series 1: us paracentesis · 5 of 5 slices shown]
[im 1/5]
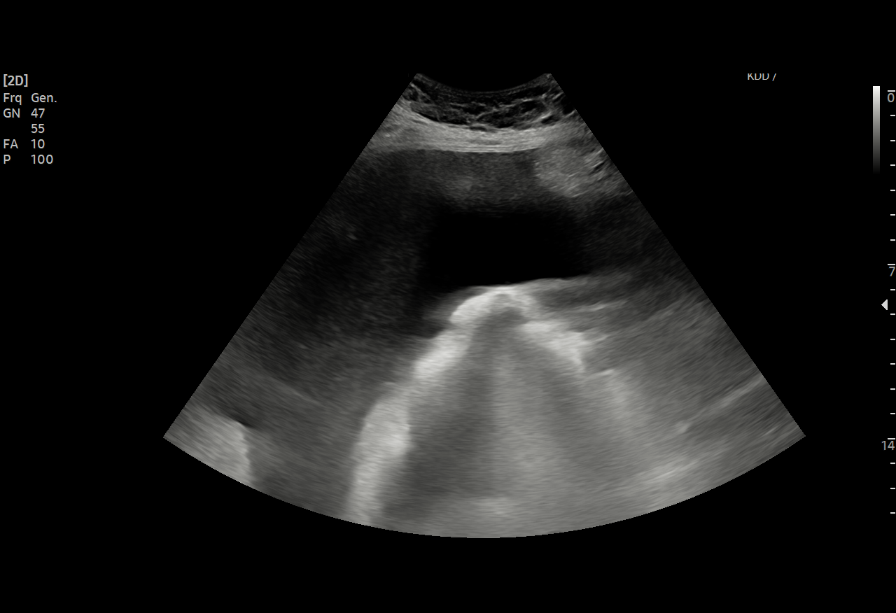
[im 2/5]
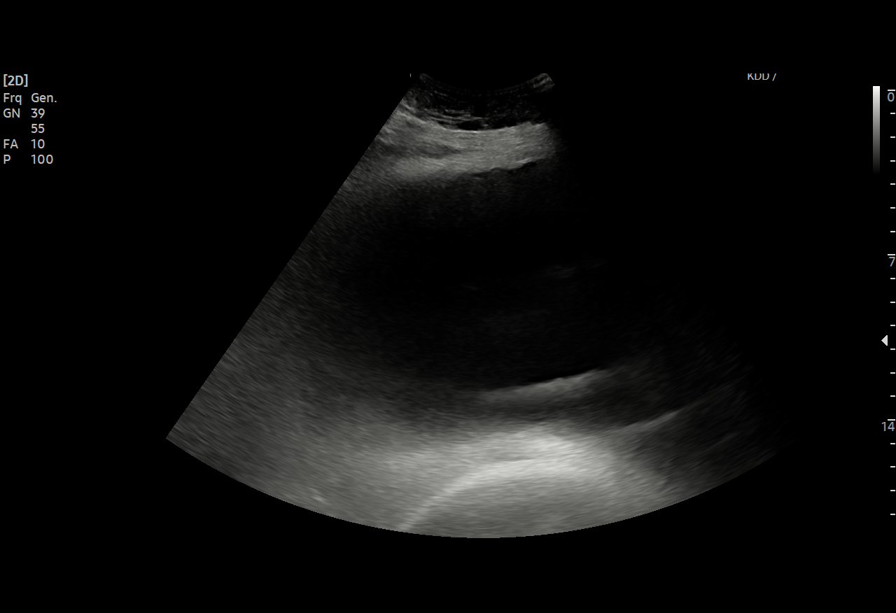
[im 3/5]
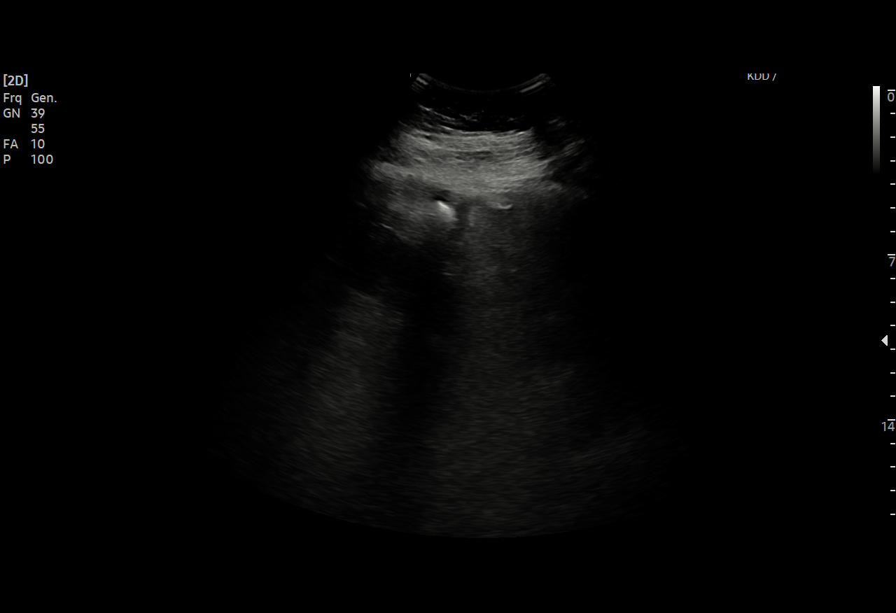
[im 4/5]
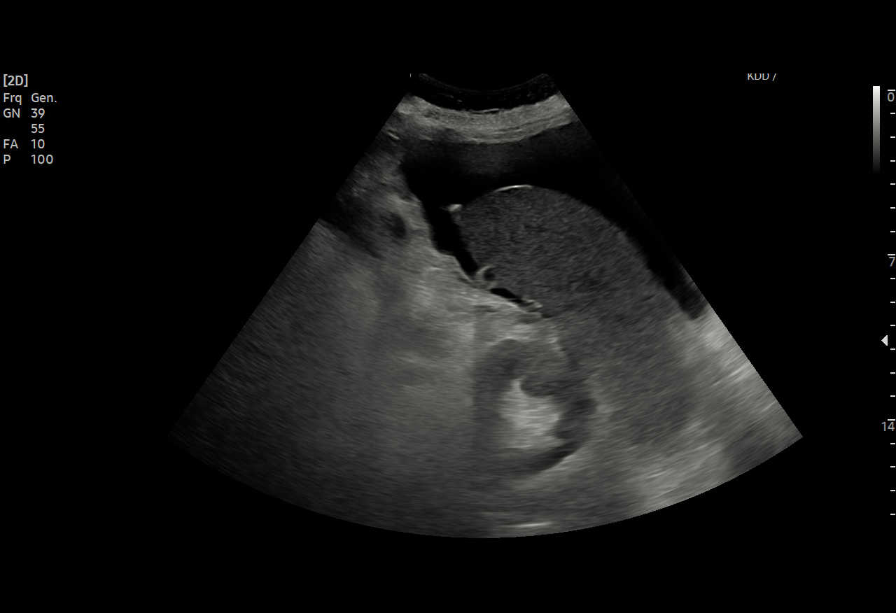
[im 5/5]
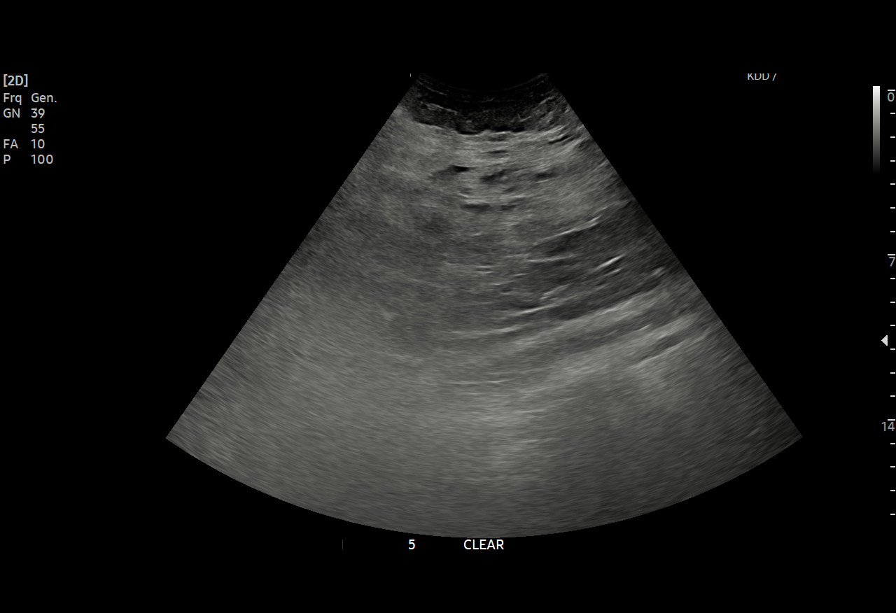

[5 of 5 positions shown; findings below may reference images not displayed]

EXAM:
ULTRASOUND GUIDED THERAPEUTIC AND DIAGNOSTIC PARACENTESIS

MEDICATIONS:
Lidocaine 1% 10 mL

COMPLICATIONS:
None immediate.

PROCEDURE:
Informed written consent was obtained from the patient after a
discussion of the risks, benefits and alternatives to treatment. A
timeout was performed prior to the initiation of the procedure.

Initial ultrasound scanning demonstrates a moderate amount of
ascites within the right lower abdominal quadrant. The right lower
abdomen was prepped and draped in the usual sterile fashion. 1%
lidocaine was used for local anesthesia.

Following this, a 19 gauge, 10-cm, Yueh catheter was introduced. An
ultrasound image was saved for documentation purposes. The
paracentesis was performed. The catheter was removed and a dressing
was applied. The patient tolerated the procedure well without
immediate post procedural complication.
Patient received post-procedure intravenous albumin; see nursing
notes for details.
FINDINGS: A total of approximately 5 L of straw-colored fluid was removed.
Samples were sent to the laboratory as requested by the clinical
team.
IMPRESSION: Successful ultrasound-guided therapeutic and diagnostic paracentesis
yielding 5 liters of peritoneal fluid.

Read by: Erfane Melich, NP
